# Patient Record
Sex: Male | Born: 1945 | Race: White | Hispanic: No | Smoking: Former smoker
Health system: Southern US, Community
[De-identification: ages and names within clinical notes are randomized; demographics above are authoritative.]

## PROBLEM LIST (undated history)

## (undated) DIAGNOSIS — R569 Unspecified convulsions: Secondary | ICD-10-CM

## (undated) DIAGNOSIS — K5900 Constipation, unspecified: Secondary | ICD-10-CM

## (undated) DIAGNOSIS — J449 Chronic obstructive pulmonary disease, unspecified: Secondary | ICD-10-CM

## (undated) DIAGNOSIS — F0150 Vascular dementia without behavioral disturbance: Secondary | ICD-10-CM

## (undated) DIAGNOSIS — H04129 Dry eye syndrome of unspecified lacrimal gland: Secondary | ICD-10-CM

## (undated) DIAGNOSIS — F0390 Unspecified dementia without behavioral disturbance: Secondary | ICD-10-CM

## (undated) DIAGNOSIS — I2510 Atherosclerotic heart disease of native coronary artery without angina pectoris: Secondary | ICD-10-CM

## (undated) DIAGNOSIS — D649 Anemia, unspecified: Secondary | ICD-10-CM

## (undated) DIAGNOSIS — D696 Thrombocytopenia, unspecified: Secondary | ICD-10-CM

## (undated) DIAGNOSIS — M1990 Unspecified osteoarthritis, unspecified site: Secondary | ICD-10-CM

## (undated) DIAGNOSIS — I679 Cerebrovascular disease, unspecified: Secondary | ICD-10-CM

## (undated) DIAGNOSIS — K219 Gastro-esophageal reflux disease without esophagitis: Secondary | ICD-10-CM

## (undated) DIAGNOSIS — I10 Essential (primary) hypertension: Secondary | ICD-10-CM

## (undated) DIAGNOSIS — E785 Hyperlipidemia, unspecified: Secondary | ICD-10-CM

## (undated) DIAGNOSIS — F329 Major depressive disorder, single episode, unspecified: Secondary | ICD-10-CM

## (undated) DIAGNOSIS — N289 Disorder of kidney and ureter, unspecified: Secondary | ICD-10-CM

---

## 1999-08-28 ENCOUNTER — Emergency Department (HOSPITAL_COMMUNITY): Admission: EM | Admit: 1999-08-28 | Discharge: 1999-08-28 | Payer: Self-pay | Admitting: Emergency Medicine

## 1999-11-28 ENCOUNTER — Emergency Department (HOSPITAL_COMMUNITY): Admission: EM | Admit: 1999-11-28 | Discharge: 1999-11-28 | Payer: Self-pay | Admitting: Emergency Medicine

## 1999-11-28 ENCOUNTER — Encounter: Payer: Self-pay | Admitting: Emergency Medicine

## 2003-05-22 ENCOUNTER — Other Ambulatory Visit: Payer: Self-pay

## 2007-11-14 ENCOUNTER — Ambulatory Visit: Payer: Self-pay | Admitting: Cardiology

## 2007-11-14 ENCOUNTER — Inpatient Hospital Stay (HOSPITAL_COMMUNITY): Admission: EM | Admit: 2007-11-14 | Discharge: 2007-11-15 | Payer: Self-pay | Admitting: Emergency Medicine

## 2007-11-15 ENCOUNTER — Encounter (INDEPENDENT_AMBULATORY_CARE_PROVIDER_SITE_OTHER): Payer: Self-pay | Admitting: *Deleted

## 2009-10-13 ENCOUNTER — Emergency Department: Payer: Self-pay | Admitting: Emergency Medicine

## 2009-10-30 ENCOUNTER — Emergency Department: Payer: Self-pay | Admitting: Emergency Medicine

## 2009-10-31 ENCOUNTER — Emergency Department: Payer: Self-pay | Admitting: Emergency Medicine

## 2009-11-01 ENCOUNTER — Inpatient Hospital Stay: Payer: Self-pay | Admitting: Unknown Physician Specialty

## 2009-11-09 ENCOUNTER — Emergency Department: Payer: Self-pay | Admitting: Emergency Medicine

## 2009-11-11 ENCOUNTER — Emergency Department: Payer: Self-pay | Admitting: Emergency Medicine

## 2009-11-13 ENCOUNTER — Emergency Department: Payer: Self-pay | Admitting: Emergency Medicine

## 2009-11-15 ENCOUNTER — Inpatient Hospital Stay: Payer: Self-pay | Admitting: Unknown Physician Specialty

## 2009-12-15 ENCOUNTER — Emergency Department: Payer: Self-pay | Admitting: Emergency Medicine

## 2009-12-16 ENCOUNTER — Emergency Department: Payer: Self-pay | Admitting: Internal Medicine

## 2009-12-17 ENCOUNTER — Emergency Department: Payer: Self-pay | Admitting: Emergency Medicine

## 2009-12-18 ENCOUNTER — Emergency Department: Payer: Self-pay | Admitting: Emergency Medicine

## 2010-01-23 ENCOUNTER — Ambulatory Visit: Payer: Self-pay | Admitting: Family Medicine

## 2010-01-27 ENCOUNTER — Ambulatory Visit: Payer: Self-pay | Admitting: Ophthalmology

## 2010-03-05 ENCOUNTER — Emergency Department: Payer: Self-pay | Admitting: Emergency Medicine

## 2010-03-07 ENCOUNTER — Inpatient Hospital Stay: Payer: Self-pay | Admitting: Psychiatry

## 2010-03-13 ENCOUNTER — Emergency Department: Payer: Self-pay | Admitting: Unknown Physician Specialty

## 2010-03-25 ENCOUNTER — Inpatient Hospital Stay: Payer: Self-pay | Admitting: Psychiatry

## 2010-04-01 ENCOUNTER — Inpatient Hospital Stay: Payer: Self-pay | Admitting: Internal Medicine

## 2010-04-08 LAB — PATHOLOGY REPORT

## 2010-04-16 ENCOUNTER — Ambulatory Visit: Payer: Self-pay | Admitting: Vascular Surgery

## 2010-04-28 ENCOUNTER — Emergency Department: Payer: Self-pay | Admitting: Emergency Medicine

## 2010-04-29 ENCOUNTER — Emergency Department: Payer: Self-pay | Admitting: Emergency Medicine

## 2010-05-29 ENCOUNTER — Ambulatory Visit: Payer: Self-pay | Admitting: Internal Medicine

## 2010-06-18 ENCOUNTER — Inpatient Hospital Stay: Payer: Self-pay | Admitting: Internal Medicine

## 2010-07-13 ENCOUNTER — Emergency Department: Payer: Self-pay | Admitting: Emergency Medicine

## 2010-07-19 ENCOUNTER — Inpatient Hospital Stay: Payer: Self-pay | Admitting: Psychiatry

## 2010-07-20 DIAGNOSIS — I499 Cardiac arrhythmia, unspecified: Secondary | ICD-10-CM

## 2010-08-25 NOTE — Consult Note (Signed)
NAMENATHIAN, Reginald Blair   MEDICAL RECORD NO.:  [id number]          PATIENT TYPE:  INP   LOCATION:  4739                         FACILITY:  [facility]   PHYSICIAN:  Antonietta Breach, M.D.  DATE OF BIRTH:  1945-07-14   DATE OF CONSULTATION:  11/15/2007  DATE OF DISCHARGE:                                 CONSULTATION   REQUESTING PHYSICIAN:  [facility].   REASON FOR CONSULTATION:  Alcohol dependence, suicidal ideation.   HISTORY OF PRESENT ILLNESS:  [name] is a 65 year old male  admitted to the [facility] on November 14, 2007 with chest pain and  suicidal ideation.   The patient does acknowledge alcohol relapse.  He also stated when he  first arrived to the hospital that he wanted to hurt himself.  He was  planning to cut his wrist.  He acknowledged a history of suicide attempt  in the past where he had to have surgery on his hand for cutting his  left wrist.   By the day of the consultation [name] is no longer having thoughts  of harming himself or others.  He is having intact hope and constructive  future goals.  He describes that he is going to get out and get back to  work.  He does not have hallucinations or delusions.  He does have  intact memory and orientation function.  He is socially appropriate.   PAST PSYCHIATRIC HISTORY:  [name] does acknowledge that he has a  history of bipolar disorder and has been stable when he is on his Prozac  combined with Depakote.  He also acknowledges that he has not been  compliant with those medications recently.   He also has a history of a seizure from alcohol withdrawal and does have  a history of delirium tremens in the past.   FAMILY PSYCHIATRIC HISTORY:  None known.  However, his father was killed  when he was coming out of a bar in his 22s.   SOCIAL HISTORY:  [name] has girlfriends the drink alcohol.  He has  a history of drinking 1/2 gallon of alcohol a day when he  binges.  He  does have a daughter.  He is divorced.   He works as a Education administrator   PAST MEDICAL HISTORY:  Seizure disorder.  He ran out of his Dilantin.   MENTAL STATUS EXAM:  [name] is alert.  He is oriented to all  spheres.  Attention span within normal limits.  Mood within normal  limits.  Affect mildly flat.  Memory function is intact.  Orientation  function intact.  Fund of knowledge, intelligence within normal limits.  Speech within normal limits.  Thought process logical, coherent, goal-  directed.  No looseness of associations.  Thought content:  No thoughts  of harming himself.  No thoughts of harming others.  No delusions or  hallucinations.  His insight regarding his alcohol dependence is intact,  judgment is intact.   ASSESSMENT:  Axis I:  Alcohol dependence.  Bipolar disorder, not  otherwise specified.  Axis II:  Deferred.  Axis III:  See past medical history.  Axis IV:  Primary support group.  Axis V:  55.   [name] is not at risk to harm himself or others.  He does agree to  call emergency services immediately for any thoughts of harming himself,  thoughts of harming others or other psychiatric emergency symptoms.   The undersigned reinforced education on the tools for preventing mood  episodes including medication and the tools of maintaining alcohol  dependence including 12-step groups.   The undersigned did recommend that the patient attend an inpatient  program, which would include chemical dependence rehabilitation,  prevention skills regarding relapse.  However, the patient declined.  He  will give it some thought and if he agrees to attend a program such as  [facility], he will call the [facility] number to receive  information.   He does agree to attend 12-step groups and follow up with the [facility] mental health center for his Depakote and Prozac supply   [name] declined residential treatment as above and he is no  longer  committable after recovering from his acute symptoms that were present  upon admission.   [name] is psychiatrically cleared for discharge.      Antonietta Breach, M.D.  Electronically Signed     JW/MEDQ  D:  11/15/2007  T:  11/15/2007  Job:  [id number]

## 2010-08-25 NOTE — H&P (Signed)
NAME:  Reginald Blair, Reginald Blair NO.:  [id number]   MEDICAL RECORD NO.:  [id number]          PATIENT TYPE:  INP   LOCATION:  1850                         FACILITY:  [facility]   PHYSICIAN:  Ladell Pier, M.D.   DATE OF BIRTH:  07/21/1945   DATE OF ADMISSION:  11/14/2007  DATE OF DISCHARGE:                              HISTORY & PHYSICAL   CHIEF COMPLAINT:  Chest pain and wanting to hurt himself.   HISTORY OF PRESENT ILLNESS:  The patient is a 65 year old white male,  past medical history significant for question of MI in the past, seizure  disorder secondary to alcohol withdrawal, hypertension.  The patient  stated that one day ago he had some substernal chest pain with some  diaphoresis but no shortness of breath.  No nausea or vomiting.  The  chest pain lasted about 20 minutes.  He had a similar episode in 1997  and he was told he had a heart attack then.  He said he did not have a  cardiac catheterization then.  He had a stress test.  He is not sure  what the result was.  He stated that he wanted to hurt himself.  He was  planning to cut his wrist.  He had multiple marks on his hand.  He said  he has tried it in the past and had to have surgery on his hand  secondary to almost cutting his left wrist off.  He said he has bipolar  and he is very depressed.   PAST MEDICAL HISTORY:  1. Significant for bipolar versus manic depression.  2. Hypertension.  3. Seizure from alcohol withdrawal.  4. Question of a history of MI in the past.  5. Alcohol and tobacco abuse.   FAMILY HISTORY:  Mother died of colon cancer at 16.  Father died in his  40s.  He was killed when he was coming out of a bar.   SOCIAL HISTORY:  Smokes about one pack of cigarettes per day.  Drinks  about half a gallon of alcohol a day when he goes on a binge.  He has  one daughter.  He is married.  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   MEDICATIONS:  He is supposed to be on Depakote 1000, but he  ran out,  Dilantin 100 twice a day, Prozac 40 mg daily and HCTZ 25 mg daily.   ALLERGIES:  No known drug allergies.   REVIEW OF SYSTEMS:  As per stated in the HPI.   PHYSICAL EXAMINATION:  VITAL SIGNS:  Temperature 98.4, blood pressure  168/91, pulse of 94, respirations 30, pulse ox 96% on room air.  HEENT:  Normocephalic, atraumatic.  Pupils reactive to light.  Throat  without erythema.  CARDIOVASCULAR:  Regular rate and rhythm.  LUNGS:  Clear bilaterally.  ABDOMEN:  Soft, nontender, nondistended.  Positive bowel sounds.  EXTREMITIES:  Without edema.  He does have healed scars on both arms and  forearms.   LABORATORIES:  WBC 3.7, hemoglobin 12.2, MCV of 72.8, platelet of 210.  UA negative.  UDS negative.  Sodium 135, potassium 3.5, chloride 100,  CO2 22, glucose 75, BUN 5, creatinine 0.67.  LFTs normal.  Alcohol level  142, lipase 22.  Valproic acid less than 10.  Dilantin level 3.6.  First  set of cardiac enzymes negative.  Chest x-ray:  No acute disease.  EKG:  No ST segment elevation or depression.   ASSESSMENT/PLAN:  1. Chest pain.  2. Hypertension.  3. Hypokalemia.  4. Alcohol and tobacco abuse.  5. History of alcohol withdrawal seizures.  6. Bipolar.  7. Suicidal ideation.  8. Microcytic anemia.   We will admit the patient to the hospital.  We will rule out for MI with  serial enzymes and repeat EKG in the morning and check 2-D echo.  Continue HCTZ for his hypertension, replete his potassium.  If it does  not correct in the morning, then we will check magnesium level.  We will  cover him with Librium for his alcohol use.  We will put him on a  nicotine patch for his tobacco use.  Will continue him on Dilantin and  Depakote.  We will continue him on his Prozac for his depression.  We  will do suicide precautions and get him a Comptroller.  We will check an  anemia panel for his microcytic anemia.      Ladell Pier, M.D.  Electronically Signed     NJ/MEDQ  D:   11/14/2007  T:  11/14/2007  Job:  [id number]

## 2010-08-25 NOTE — Discharge Summary (Signed)
NAMEGRAYLIN, SPERLING NO.:  [id number]   MEDICAL RECORD NO.:  [id number]          PATIENT TYPE:  INP   LOCATION:  4739                         FACILITY:  [facility]   PHYSICIAN:  Lonia Blood, M.D.       DATE OF BIRTH:  07/24/1945   DATE OF ADMISSION:  11/14/2007  DATE OF DISCHARGE:  11/15/2007                               DISCHARGE SUMMARY   PRIMARY CARE PHYSICIAN:  This patient does not have a primary care  physician.   DISCHARGE DIAGNOSES:  1. Chest pain, ruled out for myocardial infarction.  2. Alcoholism.  3. History of alcohol withdrawal seizures.  4. Bicytopenia, most likely due to medications and alcohol.  5. Suicidal ideations.   DISCHARGE MEDICATIONS:  1. Vitamin B1 100 mg daily.  2. Folic acid 1 mg daily.   CONDITION ON DISCHARGE:  Mr. Bunte has left the hospital, and he was  instructed to follow up with [facility].  He was also  provided with information about alcohol and drug services as well as  [facility] for psychiatric followup.  The patient was instructed to  report back to the hospital in case he feels suicidal or he wants to get  detoxed from alcohol.   CONSULTATION:  During this admission, this patient was seen in  consultation by Dr. Antonietta Breach from Psychiatry.   HOSPITAL COURSE:  1. Chest pain.  Mr. Almanza was admitted via the emergency room with      complaints of chest pain.  He indeed has some risk factors      including elevated LDL, his age, his family history, and being a      man.  The patient was ruled out for acute myocardial infarction,      and he was offered alcohol detoxification and to follow up with a      cardiac stress test.  Mr. Demetriou though decided to go against      these recommendations and not pursue his alcohol detoxification and      further cardiac workup for now.  He was instructed to follow up      with the [facility] if he decides to do it in the       future.  2. History of seizures, most likely alcohol withdrawal, as Mr.      Beer is not planning to stop drinking alcohol.  He is probably      not at high risk of having breakthrough seizures.  He was      noncompliant with his prior medications including Depakote and      phenytoin, but there is a good argument that he might actually not      need them.  If he decides to quit drinking alcohol, Depakote      probably would be the best one to use as it will act also      as a mood stabilizer.  3. Alcoholism.  Mr. Auker was offered alcohol detoxification in the      hospital and then outpatient, but he did refuse.  Lonia Blood, M.D.  Electronically Signed     SL/MEDQ  D:  11/15/2007  T:  11/16/2007  Job:  [id number]

## 2010-08-26 ENCOUNTER — Ambulatory Visit: Payer: Self-pay | Admitting: Ophthalmology

## 2010-09-16 ENCOUNTER — Emergency Department: Payer: Self-pay | Admitting: Emergency Medicine

## 2010-09-20 ENCOUNTER — Inpatient Hospital Stay: Payer: Self-pay | Admitting: Internal Medicine

## 2010-10-10 ENCOUNTER — Inpatient Hospital Stay: Payer: Self-pay | Admitting: Psychiatry

## 2010-10-28 ENCOUNTER — Emergency Department: Payer: Self-pay | Admitting: Emergency Medicine

## 2011-01-08 LAB — BASIC METABOLIC PANEL
BUN: 5 — ABNORMAL LOW
Calcium: 8.9
Creatinine, Ser: 0.77
GFR calc non Af Amer: >60
Glucose, Bld: 99
Potassium: 3.4 — ABNORMAL LOW

## 2011-01-08 LAB — RAPID URINE DRUG SCREEN, HOSP PERFORMED
Barbiturates: NOT DETECTED
Benzodiazepines: NOT DETECTED
Cocaine: NOT DETECTED
Opiates: NOT DETECTED

## 2011-01-08 LAB — DIFFERENTIAL
Basophils Absolute: 0
Basophils Absolute: 0
Eosinophils Absolute: 0.1
Eosinophils Relative: 1
Eosinophils Relative: 2
Lymphocytes Relative: 47 — ABNORMAL HIGH
Lymphs Abs: 1.2
Monocytes Absolute: 0.3
Monocytes Relative: 8
Neutro Abs: 2.4
Neutrophils Relative %: 39 — ABNORMAL LOW
Neutrophils Relative %: 64

## 2011-01-08 LAB — CBC
HCT: 36.5 — ABNORMAL LOW
Hemoglobin: 12.2 — ABNORMAL LOW
MCHC: 33.4
MCV: 72.8 — ABNORMAL LOW
Platelets: 186
Platelets: 210
RBC: 5.01
RDW: 19.4 — ABNORMAL HIGH
RDW: 19.9 — ABNORMAL HIGH
WBC: 2.6 — ABNORMAL LOW
WBC: 3.7 — ABNORMAL LOW

## 2011-01-08 LAB — LIPID PANEL
Triglycerides: 64
VLDL: 13

## 2011-01-08 LAB — COMPREHENSIVE METABOLIC PANEL
ALT: 20
AST: 24
Albumin: 3.9
Alkaline Phosphatase: 58
BUN: 5 — ABNORMAL LOW
CO2: 22
Calcium: 8.6
Chloride: 100
Creatinine, Ser: 0.67
GFR calc Af Amer: >60
GFR calc non Af Amer: >60
Glucose, Bld: 75
Potassium: 2.9 — ABNORMAL LOW
Sodium: 135
Total Bilirubin: 0.5
Total Protein: 7.4

## 2011-01-08 LAB — TSH
TSH: 2.478
TSH: 3.311

## 2011-01-08 LAB — IRON AND TIBC: Iron: 126

## 2011-01-08 LAB — CARDIAC PANEL(CRET KIN+CKTOT+MB+TROPI)
CK, MB: 1.5
Relative Index: INVALID
Total CK: 95
Total CK: 99
Troponin I: <0.01
Troponin I: <0.01

## 2011-01-08 LAB — D-DIMER, QUANTITATIVE: D-Dimer, Quant: 0.29

## 2011-01-08 LAB — RETICULOCYTES
RBC.: 4.94
Retic Count, Absolute: 29.6
Retic Ct Pct: 0.6

## 2011-01-08 LAB — VITAMIN B12: Vitamin B-12: 172 — ABNORMAL LOW (ref 211–911)

## 2011-01-08 LAB — CK TOTAL AND CKMB (NOT AT ARMC)
CK, MB: 2.2
Relative Index: 1.7
Total CK: 132

## 2011-01-08 LAB — URINALYSIS, ROUTINE W REFLEX MICROSCOPIC
Glucose, UA: NEGATIVE
Nitrite: NEGATIVE
Specific Gravity, Urine: 1.008
pH: 5.5

## 2011-01-08 LAB — ETHANOL: Alcohol, Ethyl (B): 142 — ABNORMAL HIGH

## 2011-05-05 ENCOUNTER — Ambulatory Visit: Payer: Medicare Other | Admitting: Family Medicine

## 2012-05-08 IMAGING — CT CT HEAD WITHOUT CONTRAST
3 of 4 series · 18 of 30 positions shown, 20 images · non-contrast
Comparison: none

REASON FOR EXAM: fall
COMMENTS:

PROCEDURE:     CT  - CT HEAD WITHOUT CONTRAST  - September 20, 2010  [DATE]
RESULT:
HISTORY: Fall.

[Series 2: without · axial · non-contrast · 0.43mm/px · z∈[-127,-12]mm · 8 of 31 slices shown, 10 images]
[im 4/31  brain]
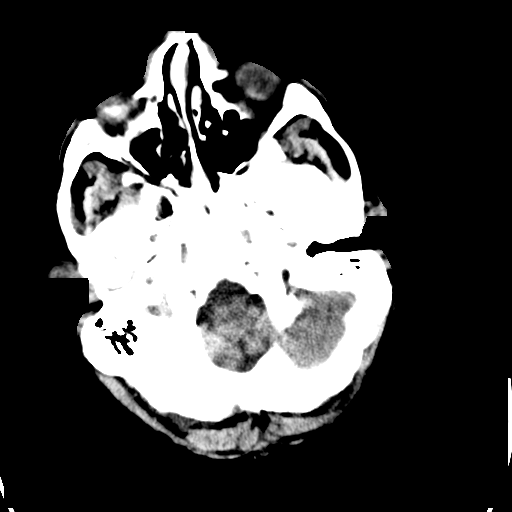
[im 4/31  bone]
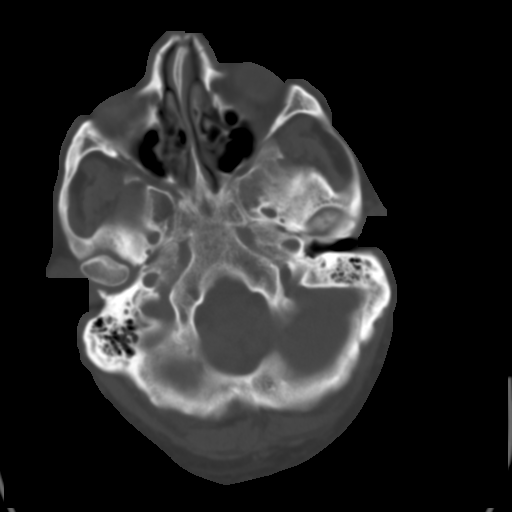
[im 7/31  brain]
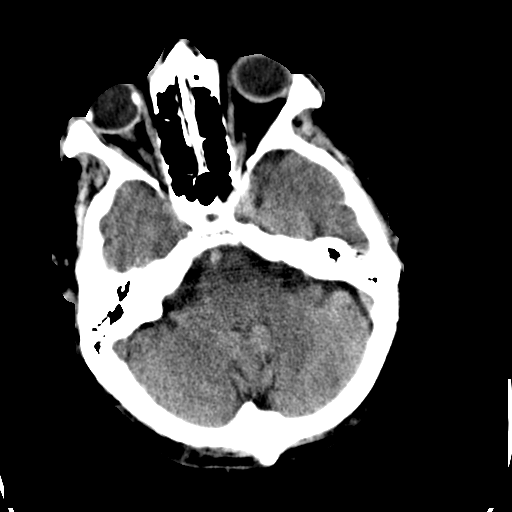
[im 11/31  brain]
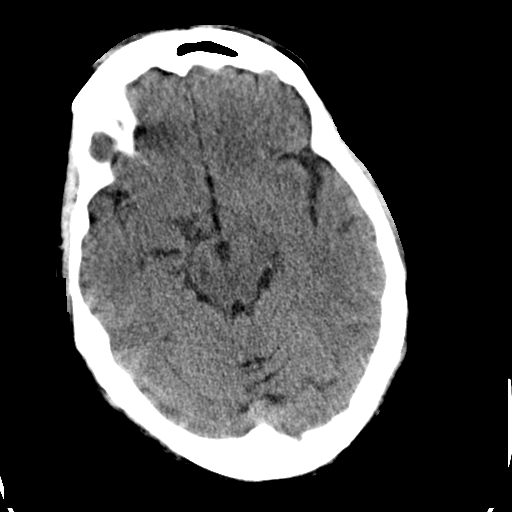
[im 14/31  brain]
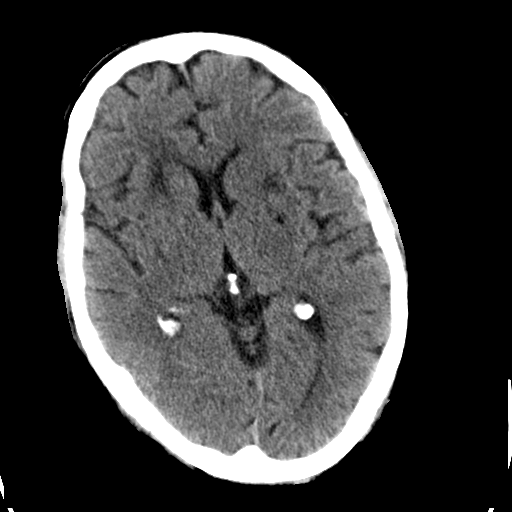
[im 17/31  brain]
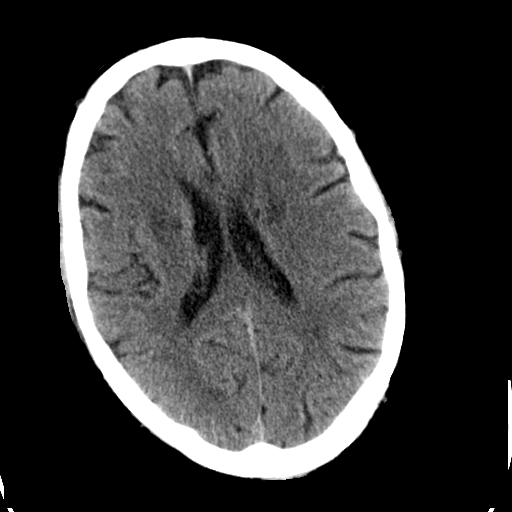
[im 17/31  bone]
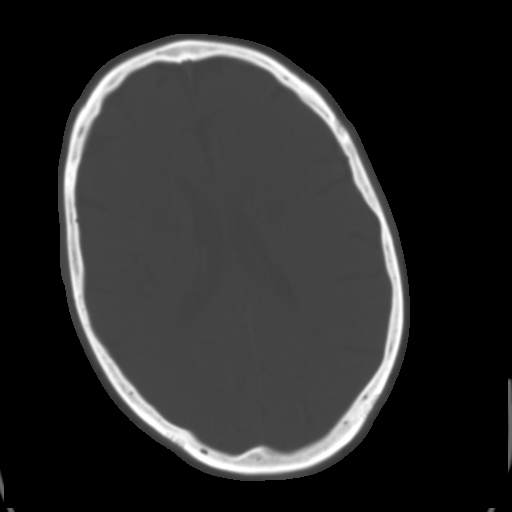
[im 21/31  brain]
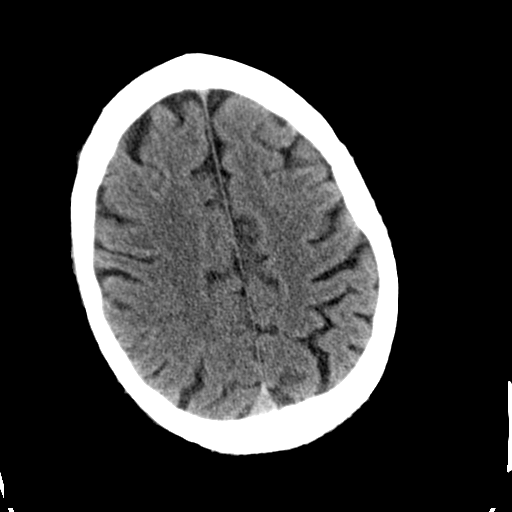
[im 24/31  brain]
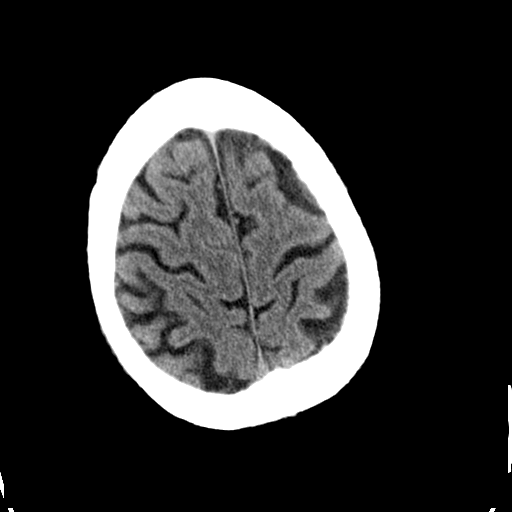
[im 27/31  brain]
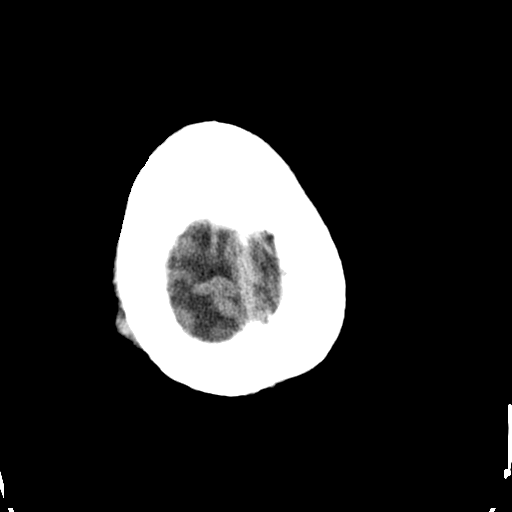

[Series 3: bone · axial · 0.43mm/px · z∈[-127,-12]mm · 7 of 31 slices shown (1 of 2)]
[im 4/31  bone]
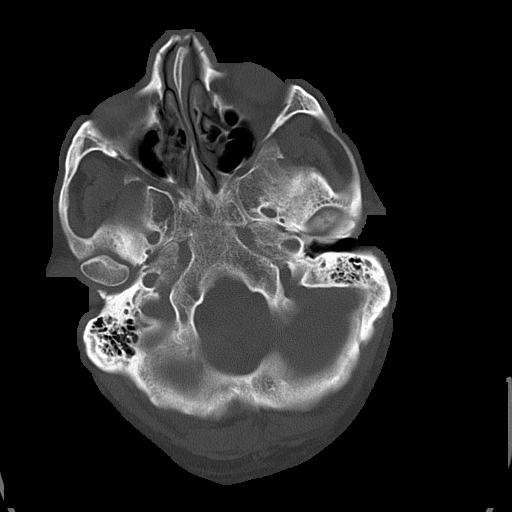
[im 8/31  bone]
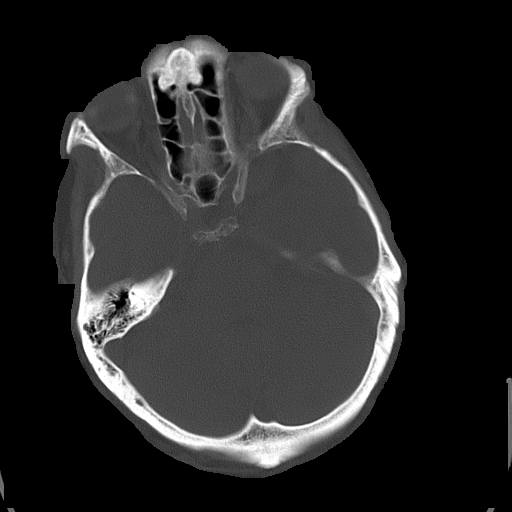
[im 12/31  bone]
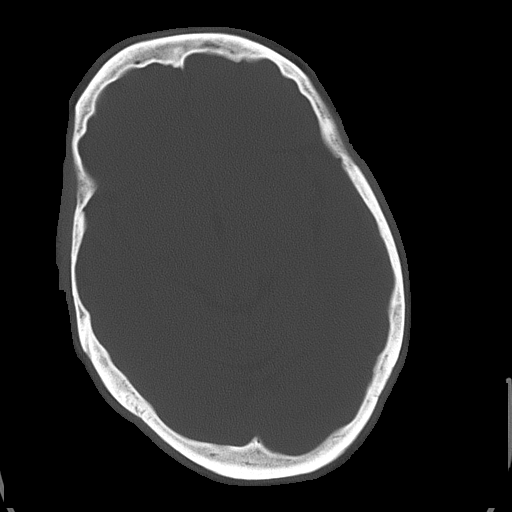
[im 16/31  bone]
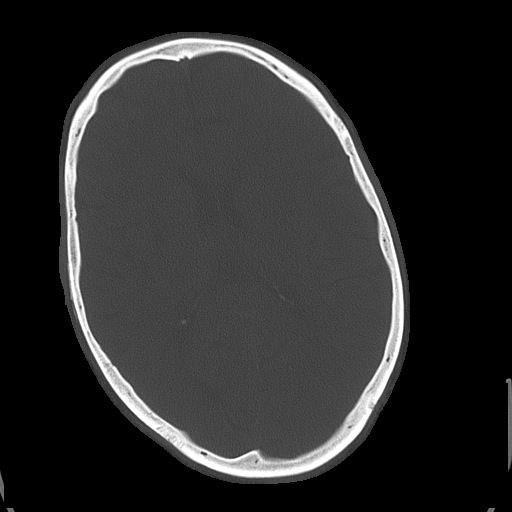
[im 19/31  bone]
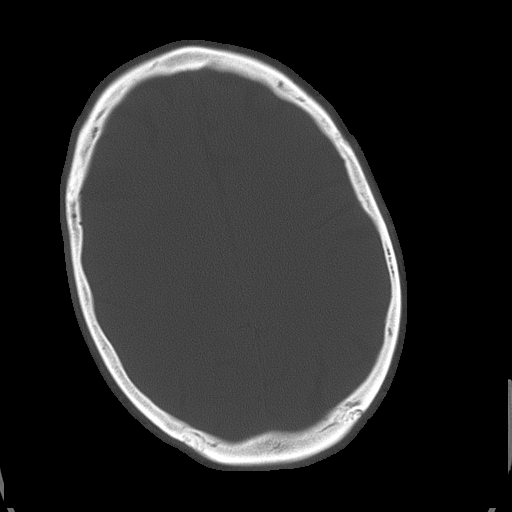
[im 23/31  bone]
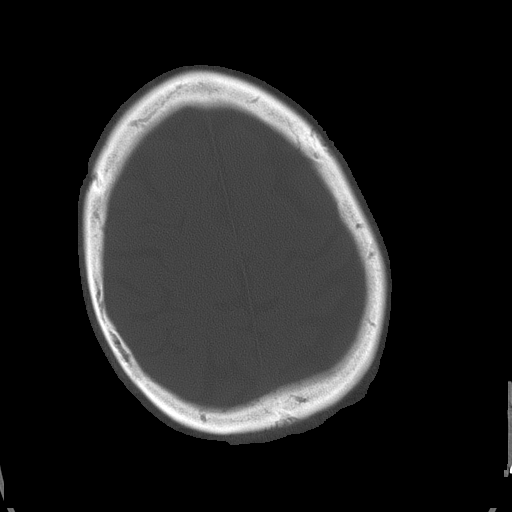
[im 27/31  bone]
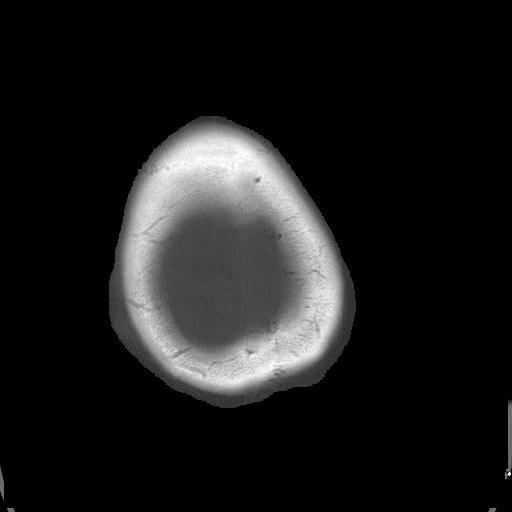

[Series 5: bone · axial · 0.43mm/px · z∈[-78,-38]mm · 3 of 18 slices shown (2 of 2)]
[im 5/18  bone]
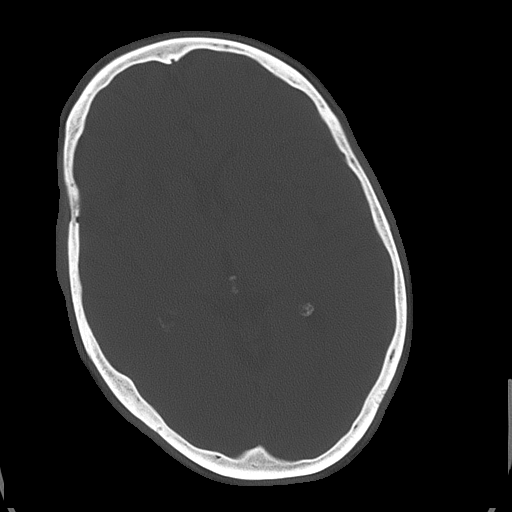
[im 9/18  bone]
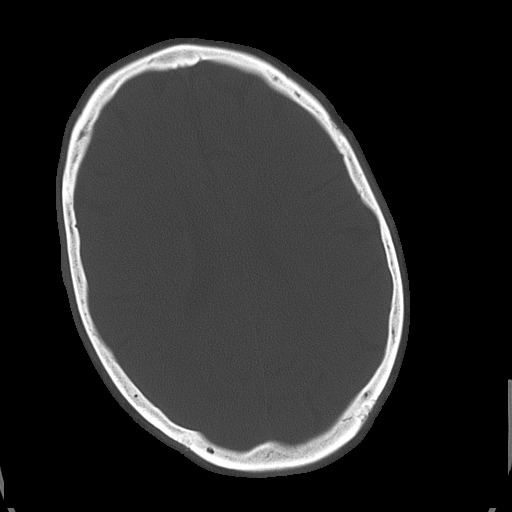
[im 13/18  bone]
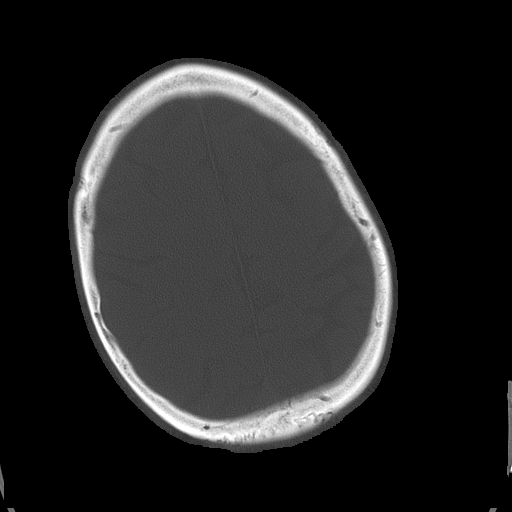

[18 of 30 positions shown; findings below may reference images not displayed]

FINDINGS: Standard nonenhanced CT was obtained. No mass lesion. No
hydrocephalus. No hemorrhage. White matter changes are noted consistent with
chronic ischemia. Lacunar infarctions are noted in the basal ganglia. No
acute bony abnormality.
IMPRESSION: 1.     No acute abnormality.
2.     Chronic ischemic change.

## 2012-05-09 IMAGING — US US RENAL KIDNEY
1 series · 17 of 25 positions shown · non-contrast
Comparison: none

REASON FOR EXAM: ARF
COMMENTS:

PROCEDURE:     US  - US KIDNEY  - September 21, 2010 [DATE]
RESULT:

[Series 1: us renal kidney · 17 of 59 slices shown]
[im 1/59]
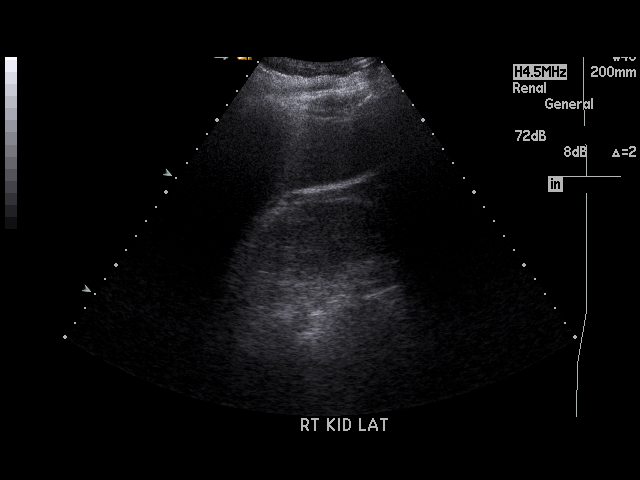
[im 5/59]
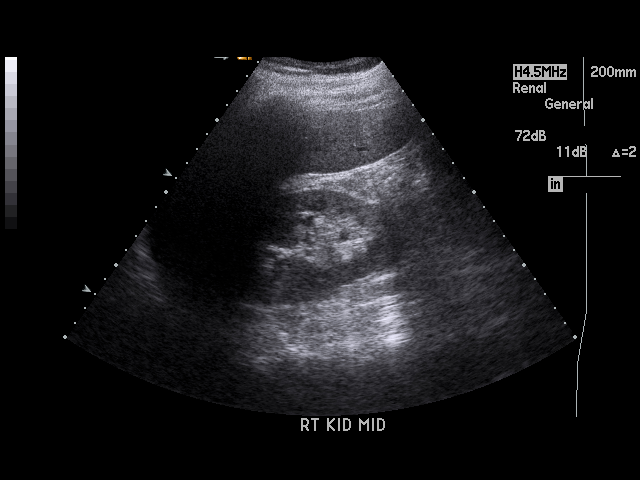
[im 8/59]
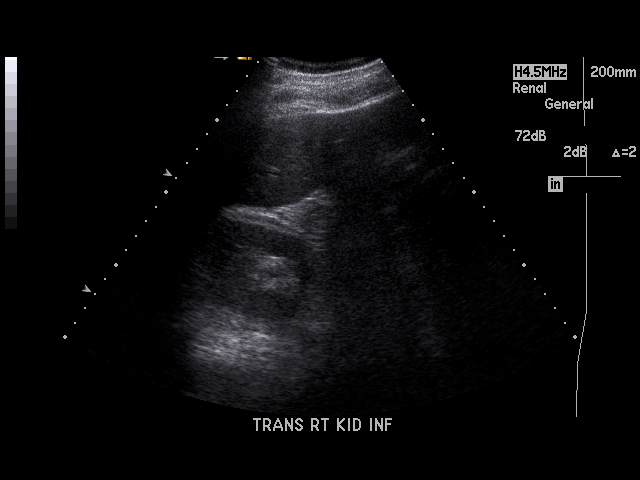
[im 13/59]
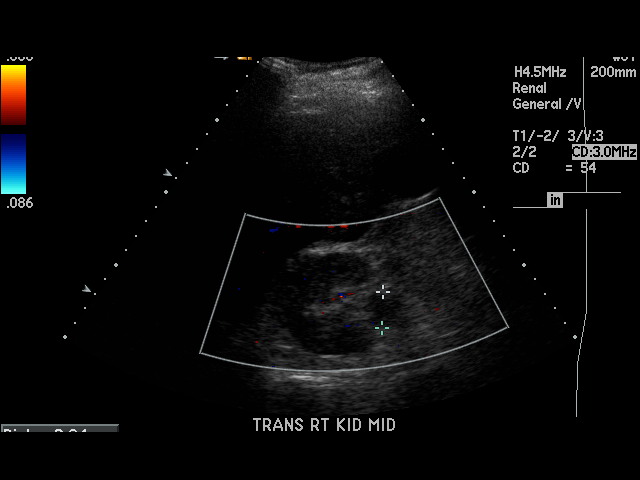
[im 15/59]
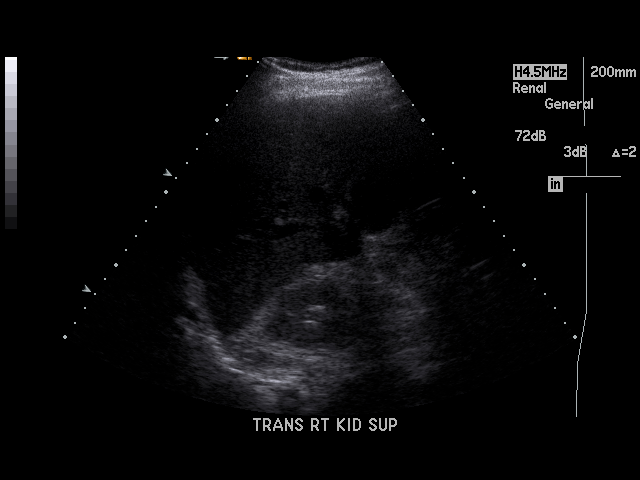
[im 20/59]
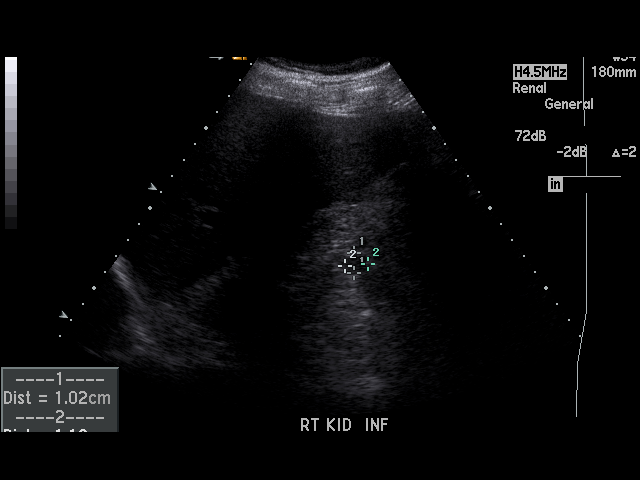
[im 22/59]
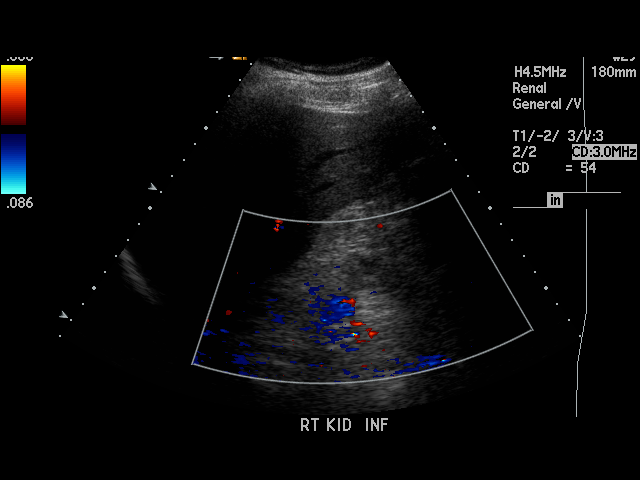
[im 27/59]
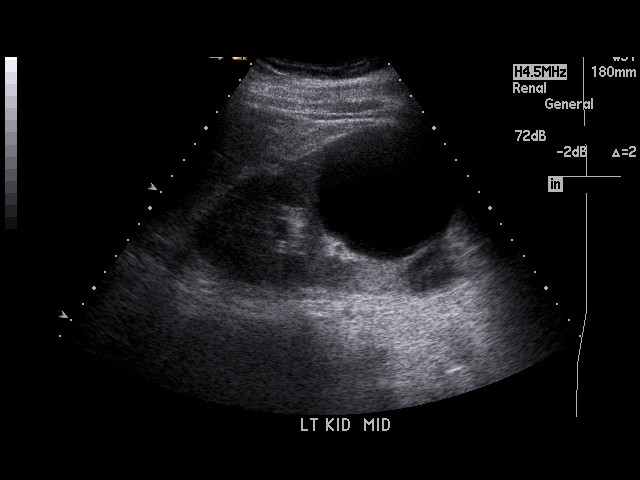
[im 30/59]
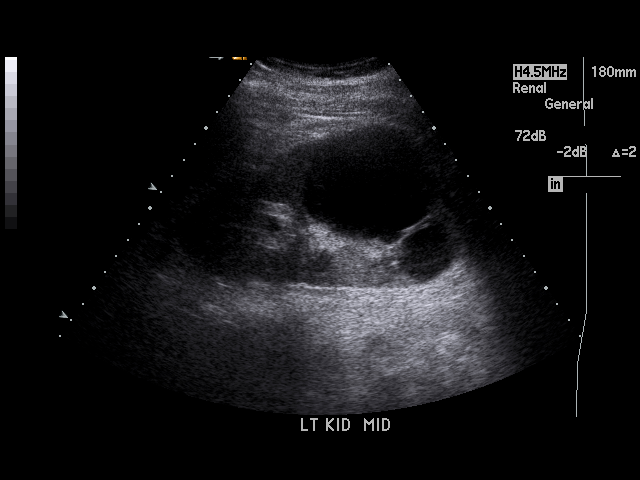
[im 32/59]
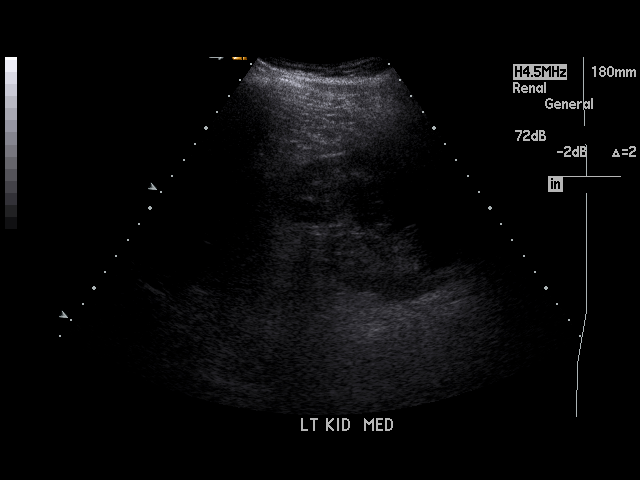
[im 37/59]
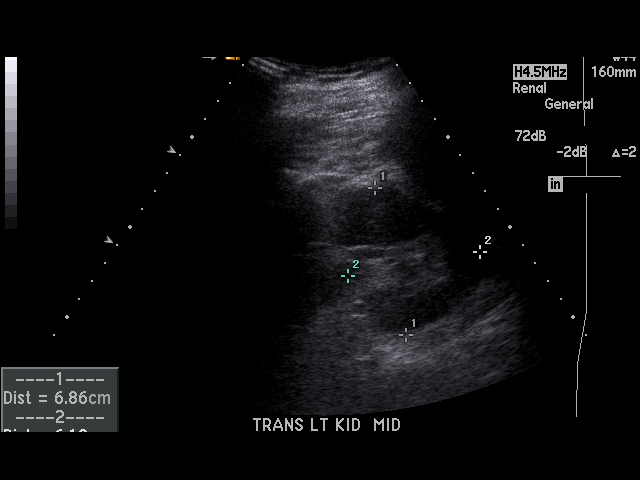
[im 39/59]
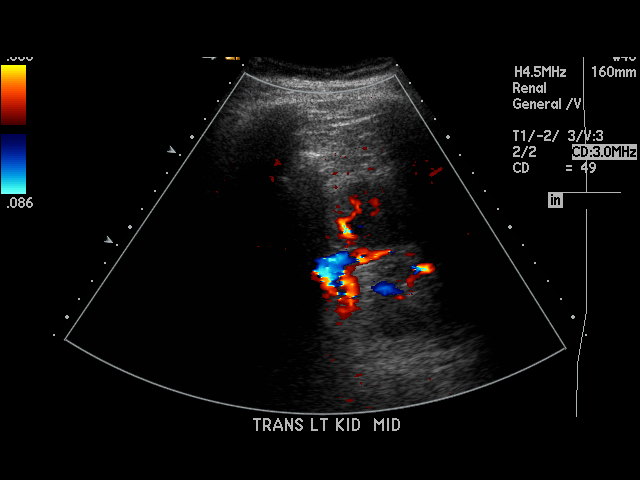
[im 44/59]
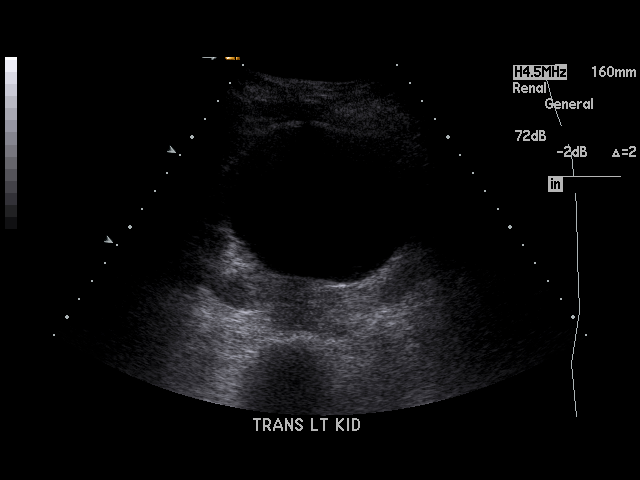
[im 46/59]
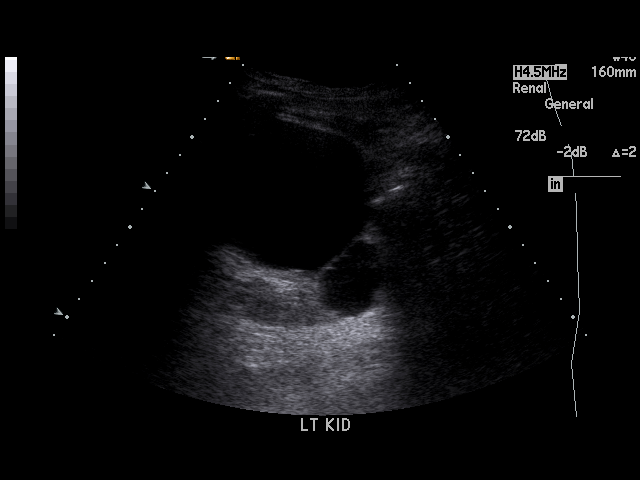
[im 51/59]
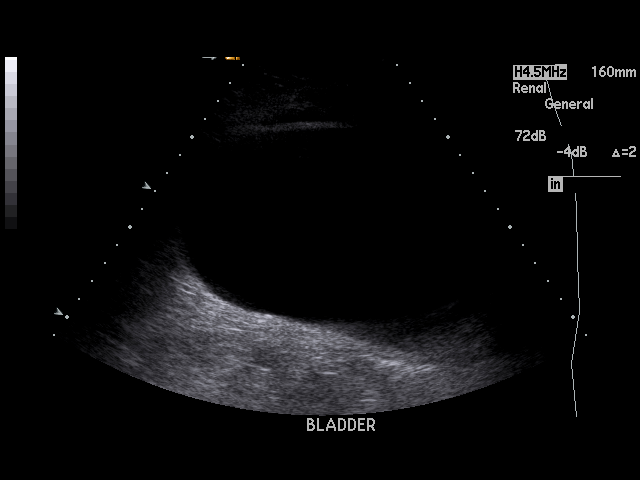
[im 54/59]
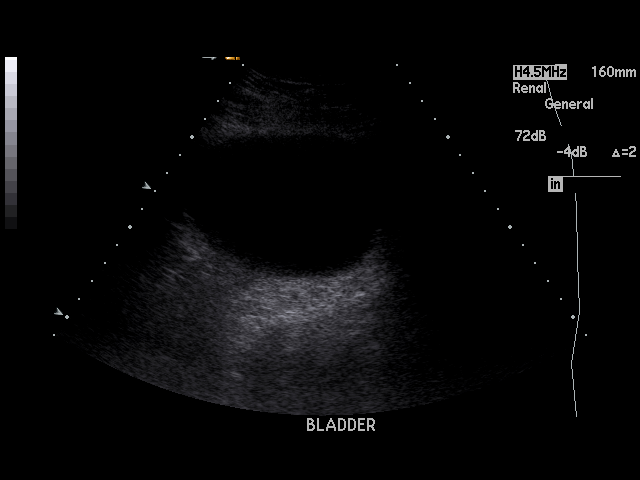
[im 59/59]
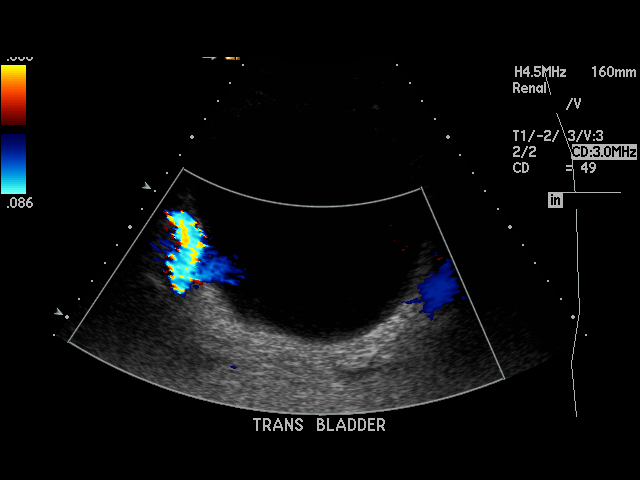

[17 of 25 positions shown; findings below may reference images not displayed]

FINDINGS: The right kidney measures 11.92 x 6 x 6 cm and the left 14.57 x
6.86 x 6.13 cm. Evaluation of the kidneys demonstrates no evidence of
hydronephrosis. A small 0.87 x 0.87 x 1.02 cm cyst is identified within the
right kidney. A larger lower pole cyst is identified within the left kidney
measuring 7.22 x 6.12 x 8.22 cm. A smaller cyst of the lower pole is also
identified within the left kidney measuring 3.66 x 2.56 x 3.62 cm. The
urinary bladder is distended and ureteral jets are not clearly identified.

The renal parenchyma otherwise demonstrates appropriate corticomedullary
differentiation without evidence of hydronephrosis or cortical thinning.
IMPRESSION: Bilateral renal cysts large on the left and small on the
right as described above.

## 2012-05-11 IMAGING — CR DG CHEST 1V PORT
1 series · 1 of 1 positions shown · non-contrast
Comparison: none

REASON FOR EXAM: wheezing, pneumonia, CHF
COMMENTS:

[view not recorded]
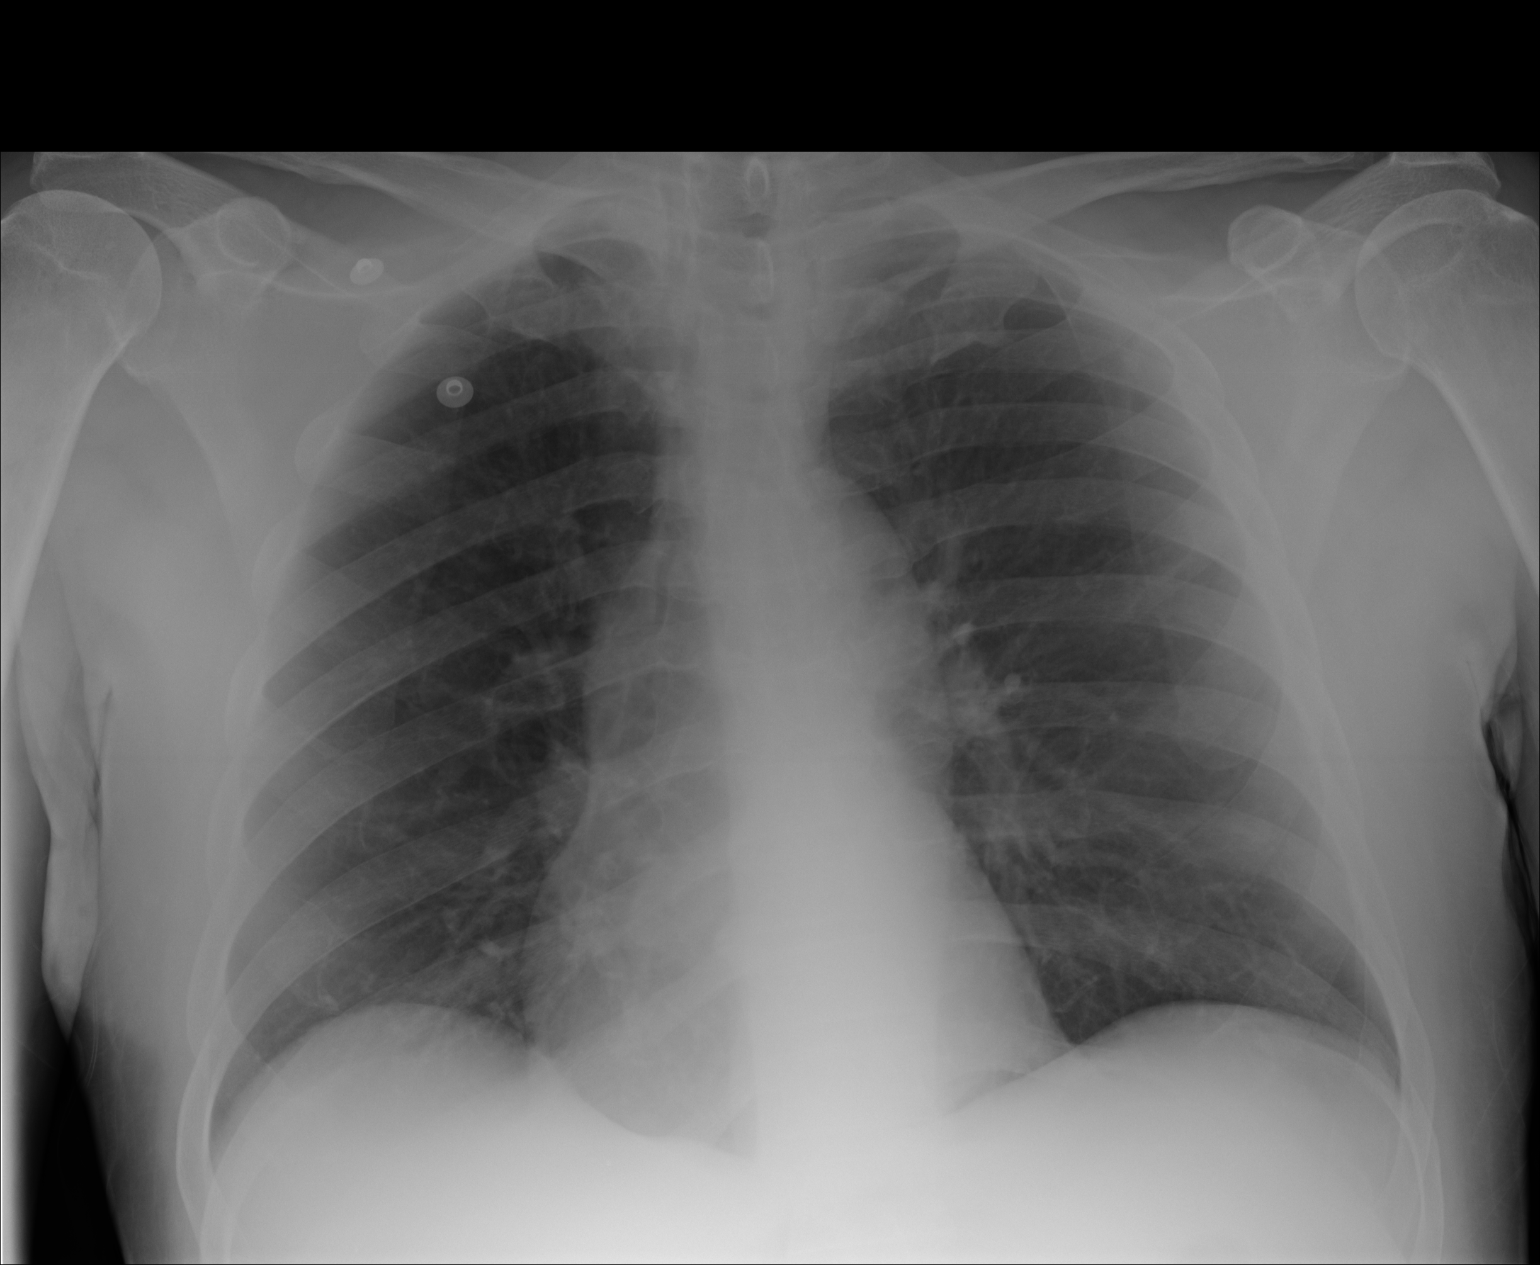

[1 of 1 positions shown; findings below may reference images not displayed]

PROCEDURE:     DXR - DXR PORTABLE CHEST SINGLE VIEW  - September 23, 2010 [DATE]

RESULT:     Comparison is made to study of 01/20/2011.

The lungs are adequately inflated and clear. The cardiac silhouette is
normal in size. The pulmonary vascularity is not engorged. I see no
pneumothorax or pleural effusion. There is mild tortuosity of the descending
thoracic aorta.
IMPRESSION: I do not see evidence of pneumonia nor other acute
cardiopulmonary abnormality.

## 2017-01-02 ENCOUNTER — Encounter: Payer: Self-pay | Admitting: Emergency Medicine

## 2017-01-02 ENCOUNTER — Emergency Department
Admission: EM | Admit: 2017-01-02 | Discharge: 2017-01-02 | Disposition: A | Discharge status: Home / Self Care | Payer: Medicare Other | Attending: Emergency Medicine | Admitting: Emergency Medicine

## 2017-01-02 DIAGNOSIS — Z87891 Personal history of nicotine dependence: Secondary | ICD-10-CM | POA: Diagnosis not present

## 2017-01-02 DIAGNOSIS — F32A Depression, unspecified: Secondary | ICD-10-CM

## 2017-01-02 DIAGNOSIS — R440 Auditory hallucinations: Secondary | ICD-10-CM | POA: Diagnosis present

## 2017-01-02 DIAGNOSIS — I10 Essential (primary) hypertension: Secondary | ICD-10-CM | POA: Diagnosis not present

## 2017-01-02 DIAGNOSIS — I2510 Atherosclerotic heart disease of native coronary artery without angina pectoris: Secondary | ICD-10-CM | POA: Diagnosis not present

## 2017-01-02 DIAGNOSIS — J449 Chronic obstructive pulmonary disease, unspecified: Secondary | ICD-10-CM | POA: Insufficient documentation

## 2017-01-02 DIAGNOSIS — F329 Major depressive disorder, single episode, unspecified: Secondary | ICD-10-CM | POA: Insufficient documentation

## 2017-01-02 HISTORY — DX: Unspecified dementia, unspecified severity, without behavioral disturbance, psychotic disturbance, mood disturbance, and anxiety: F03.90

## 2017-01-02 HISTORY — DX: Vascular dementia without behavioral disturbance: F01.50

## 2017-01-02 HISTORY — DX: Major depressive disorder, single episode, unspecified: F32.9

## 2017-01-02 HISTORY — DX: Thrombocytopenia, unspecified: D69.6

## 2017-01-02 HISTORY — DX: Gastro-esophageal reflux disease without esophagitis: K21.9

## 2017-01-02 HISTORY — DX: Dry eye syndrome of unspecified lacrimal gland: H04.129

## 2017-01-02 HISTORY — DX: Anemia, unspecified: D64.9

## 2017-01-02 HISTORY — DX: Disorder of kidney and ureter, unspecified: N28.9

## 2017-01-02 HISTORY — DX: Chronic obstructive pulmonary disease, unspecified: J44.9

## 2017-01-02 HISTORY — DX: Essential (primary) hypertension: I10

## 2017-01-02 HISTORY — DX: Atherosclerotic heart disease of native coronary artery without angina pectoris: I25.10

## 2017-01-02 HISTORY — DX: Unspecified convulsions: R56.9

## 2017-01-02 HISTORY — DX: Constipation, unspecified: K59.00

## 2017-01-02 HISTORY — DX: Hyperlipidemia, unspecified: E78.5

## 2017-01-02 HISTORY — DX: Unspecified osteoarthritis, unspecified site: M19.90

## 2017-01-02 HISTORY — DX: Cerebrovascular disease, unspecified: I67.9

## 2017-01-02 LAB — COMPREHENSIVE METABOLIC PANEL
ALBUMIN: 3.9 g/dL (ref 3.5–5.0)
ALT: 9 U/L — ABNORMAL LOW (ref 17–63)
AST: 27 U/L (ref 15–41)
Alkaline Phosphatase: 55 U/L (ref 38–126)
Anion gap: 11 (ref 5–15)
BUN: 14 mg/dL (ref 6–20)
CHLORIDE: 103 mmol/L (ref 101–111)
CO2: 25 mmol/L (ref 22–32)
Calcium: 8.8 mg/dL — ABNORMAL LOW (ref 8.9–10.3)
Creatinine, Ser: 1.15 mg/dL (ref 0.61–1.24)
GFR calc Af Amer: >60 mL/min (ref >60)
GLUCOSE: 119 mg/dL — AB (ref 65–99)
POTASSIUM: 3.7 mmol/L (ref 3.5–5.1)
Sodium: 139 mmol/L (ref 135–145)
Total Bilirubin: 0.6 mg/dL (ref 0.3–1.2)
Total Protein: 8.2 g/dL — ABNORMAL HIGH (ref 6.5–8.1)

## 2017-01-02 LAB — URINE DRUG SCREEN, QUALITATIVE (ARMC ONLY)
AMPHETAMINES, UR SCREEN: NOT DETECTED
Barbiturates, Ur Screen: NOT DETECTED
Benzodiazepine, Ur Scrn: NOT DETECTED
Cannabinoid 50 Ng, Ur ~~LOC~~: NOT DETECTED
Cocaine Metabolite,Ur ~~LOC~~: NOT DETECTED
MDMA (ECSTASY) UR SCREEN: NOT DETECTED
METHADONE SCREEN, URINE: NOT DETECTED
Opiate, Ur Screen: NOT DETECTED
PHENCYCLIDINE (PCP) UR S: NOT DETECTED
Tricyclic, Ur Screen: NOT DETECTED

## 2017-01-02 LAB — ACETAMINOPHEN LEVEL

## 2017-01-02 LAB — CBC
HCT: 40.9 % (ref 40.0–52.0)
Hemoglobin: 13.1 g/dL (ref 13.0–18.0)
MCH: 23.8 pg — ABNORMAL LOW (ref 26.0–34.0)
MCHC: 32.1 g/dL (ref 32.0–36.0)
MCV: 74.2 fL — AB (ref 80.0–100.0)
PLATELETS: 97 10*3/uL — AB (ref 150–440)
RBC: 5.51 MIL/uL (ref 4.40–5.90)
RDW: 17.3 % — ABNORMAL HIGH (ref 11.5–14.5)
WBC: 5 10*3/uL (ref 3.8–10.6)

## 2017-01-02 LAB — SALICYLATE LEVEL: Salicylate Lvl: <7 mg/dL (ref 2.8–30.0)

## 2017-01-02 LAB — ETHANOL

## 2017-01-02 MED ORDER — LORAZEPAM 0.5 MG PO TABS: 0.5000 mg | ORAL_TABLET | Freq: Three times a day (TID) | ORAL | 0 refills | Status: DC | PRN

## 2017-01-02 MED ORDER — OLANZAPINE 5 MG PO TABS
5.0000 mg | ORAL_TABLET | Freq: Once | ORAL | Status: AC
  Administered 2017-01-02: 5 mg via ORAL
  Filled 2017-01-02: qty 1

## 2017-01-02 NOTE — BH Assessment (Signed)
Assessment Note  Reginald Blair is an 71 y.o. male who arrived to [facility] today via staff personnel from [facility] where pt is currently living. Pt states the staff member brought him to the group home because he has been having suicidal thoughts about cutting himself, something he has done in the past and been hospitalized for 3-4 times (per his report). Pt admitted to having HI as well, though pt was unable to give specifics in regards to whom the victims would be. Pt showed clinician his wrists where he had cut himself in the past when asked if this was something he had done in the past.  Pt was quiet and mumbled throughout the assessment, though he was forthcoming with information. At times it appeared that pt was having difficulties understanding the questions posed. Pt provided information regarding prior substance use, which included past alcohol, marijuana, and cocaine use. Pt was did not understand questions in regards to how much of each substance he used, etc., so specifics regarding his substance use is unknown.  Pt was unable to answer whether he has a therapist or a psychiatrist, though he states he takes medication that helps him sleep 8-9 hours. He denies a history of verbal, physical or sexual abuse and states he had an uncle whom committed suicide. Pt denies any current charges, court dates, or being on probation. Pt expressed that he wants help because he felt good for several years when he was on medication 3x/day, which made it possible for him to not have hallucinations, and he would like to feel that way again.  Diagnosis: Major Depressive Disorder  Past Medical History:  Past Medical History:  Diagnosis Date  . Anemia   . Cerebrovascular disease   . Constipation   . COPD (chronic obstructive pulmonary disease) (HCC)   . Coronary artery disease   . Dry eye   . Dyslipidemia   . GERD (gastroesophageal reflux disease)   . Hypertension   . Major  depressive disorder   . Major neurocognitive disorder   . Osteoarthritis   . Renal disorder   . Seizure (HCC)   . Thrombocytopenia (HCC)     No past surgical history on file.  Family History: No family history on file.  Social History:  reports that he has quit smoking. He has never used smokeless tobacco. He reports that he does not drink alcohol or use drugs.  Additional Social History:  Alcohol / Drug Use Pain Medications: Pt denies Prescriptions: Pt denies Over the Counter: Pt denies History of alcohol / drug use?: Yes Substance #1 Name of Substance 1: Alcohol 1 - Age of First Use: Unknown 1 - Amount (size/oz): Unknown 1 - Frequency: Unknown 1 - Duration: Unknown 1 - Last Use / Amount: 2-3 years ago Substance #2 Name of Substance 2: Marijuana 2 - Age of First Use: Unknown 2 - Amount (size/oz): Unknown 2 - Frequency: Unknown 2 - Duration: Unknown 2 - Last Use / Amount: 2 years ago Substance #3 Name of Substance 3: Cocaine 3 - Age of First Use: Unknown 3 - Amount (size/oz): Unknown 3 - Frequency: Unknown 3 - Duration: Unknown 3 - Last Use / Amount: Years ago (pt was unable to provide more concrete information)  CIWA: CIWA-Ar BP: (!) 147/82 Pulse Rate: 80 COWS:    Allergies: No Known Allergies  Home Medications:  (Not in a hospital admission)  OB/GYN Status:  No LMP for male patient.  General Assessment Data Location of  Assessment: [facility] ED TTS Assessment: In system Is this a Tele or Face-to-Face Assessment?: Face-to-Face Is this an Initial Assessment or a Re-assessment for this encounter?: Initial Assessment Marital status: Divorced [place] name: N/A Is patient pregnant?: No Pregnancy Status: No Living Arrangements: Group Home ([facility]) Can pt return to current living arrangement?: Yes Admission Status: Voluntary Is patient capable of signing voluntary admission?: Yes Referral Source: Other (Group home staff member) Insurance type:  Medicare  Medical Screening Exam [facility] Walk-in ONLY) Medical Exam completed: Yes  Crisis Care Plan Living Arrangements: Group Home ([facility]) Legal Guardian: Other: ([facility]) Name of Psychiatrist: Unknown--pt unable to answer whether had psych or not Name of Therapist: Unknown--pt unable to answer whether had therapist or not  Education Status Is patient currently in school?: No Current Grade: N/A Highest grade of school patient has completed: 6th Name of school: N/A Contact person: N/A  Risk to self with the past 6 months Suicidal Ideation: Yes-Currently Present Has patient been a risk to self within the past 6 months prior to admission? : Yes Suicidal Intent: Yes-Currently Present Has patient had any suicidal intent within the past 6 months prior to admission? : Yes Is patient at risk for suicide?: Yes Suicidal Plan?: Yes-Currently Present (Pt plans to cut himself) Has patient had any suicidal plan within the past 6 months prior to admission? : Yes Specify Current Suicidal Plan: Pt plans to cut himself Access to Means: Yes Specify Access to Suicidal Means: Pt has access to knives if they are not kept locked away What has been your use of drugs/alcohol within the last 12 months?: Pt denies Previous Attempts/Gestures: Yes (Pt has previously attempted to kill himself by slitting wris) How many times?: 4 Triggers for Past Attempts: Hallucinations Intentional Self Injurious Behavior: Cutting Comment - Self Injurious Behavior: Pt has slit his wrists in the past Family Suicide History: Yes (Uncle) Recent stressful life event(s): Other (Comment) (Unknown) Persecutory voices/beliefs?: Yes Depression: No Depression Symptoms:  (None reported) Substance abuse history and/or treatment for substance abuse?: No Suicide prevention information given to non-admitted patients: Not applicable  Risk to Others within the past 6 months Homicidal  Ideation: Yes-Currently Present Thoughts of Harm to Others: Yes-Currently Present (Due to hallucinations) Comment - Thoughts of Harm to Others: Due to hallucinations Current Homicidal Intent: Yes-Currently Present (Due to hallucinations) Current Homicidal Plan: No Access to Homicidal Means: Yes (If pt has access to knives) Describe Access to Homicidal Means: If knives are kept unloced at group home Identified Victim: None noted History of harm to others?: No Assessment of Violence: On admission Violent Behavior Description: Pt has hallucinations of hurting himself and others Does patient have access to weapons?: No Criminal Charges Pending?: No Does patient have a court date: No Is patient on probation?: No  Psychosis Hallucinations: Auditory Delusions: None noted  Mental Status Report Appearance/Hygiene: Disheveled, In scrubs Eye Contact: Fair Motor Activity: Unable to assess Speech: Soft, Incoherent (Incoherent at times--required pt repeating self) Level of Consciousness: Alert Mood: Guilty, Sullen Affect: Sullen Anxiety Level: None Thought Processes: Flight of Ideas Judgement: Impaired Orientation: Place, Person, Time, Situation Obsessive Compulsive Thoughts/Behaviors: Minimal  Cognitive Functioning Concentration: Decreased Memory: Recent Intact, Remote Impaired IQ: Below Average Level of Function: Average Insight: Good Impulse Control: Fair Appetite: Good Sleep: No Change (Pt states he sleeps 8-9 hours if he takes his meds) Total Hours of Sleep: 9 Vegetative Symptoms: None  ADLScreening [facility] Assessment Services) Patient's cognitive ability adequate  to safely complete daily activities?: Yes Patient able to express need for assistance with ADLs?: Yes Independently performs ADLs?: Yes (appropriate for developmental age)  Prior Inpatient Therapy Prior Inpatient Therapy: Yes (Specifics were unknown by pt) Prior Therapy Dates: Unknown Prior Therapy  Facilty/Provider(s): Unknown Reason for Treatment: SI  Prior Outpatient Therapy Prior Outpatient Therapy:  (Unknown) Prior Therapy Dates: Unknown Prior Therapy Facilty/Provider(s): Unknown Reason for Treatment: SI Does patient have an ACCT team?: No Does patient have Intensive In-House Services?  : No Does patient have [facility] services? : No Does patient have P4CC services?: No  ADL Screening (condition at time of admission) Patient's cognitive ability adequate to safely complete daily activities?: Yes Is the patient deaf or have difficulty hearing?: No Does the patient have difficulty seeing, even when wearing glasses/contacts?: No Does the patient have difficulty concentrating, remembering, or making decisions?: Yes Patient able to express need for assistance with ADLs?: Yes Does the patient have difficulty dressing or bathing?: No Independently performs ADLs?: Yes (appropriate for developmental age) Does the patient have difficulty walking or climbing stairs?: No Weakness of Legs: None Weakness of Arms/Hands: None  Home Assistive Devices/Equipment Home Assistive Devices/Equipment: None  Therapy Consults (therapy consults require a physician order) PT Evaluation Needed: No OT Evalulation Needed: No SLP Evaluation Needed: No Abuse/Neglect Assessment (Assessment to be complete while patient is alone) Physical Abuse: Denies Verbal Abuse: Denies Sexual Abuse: Denies Exploitation of patient/patient's resources: Denies Self-Neglect: Denies Values / Beliefs Cultural Requests During Hospitalization: None Spiritual Requests During Hospitalization: None Consults Spiritual Care Consult Needed: No Social Work Consult Needed: No Merchant navy officer (For Healthcare) Does Patient Have a Medical Advance Directive?: No    Additional Information 1:1 In Past 12 Months?: No CIRT Risk: No Elopement Risk: No Does patient have medical clearance?: Yes     Disposition:     On Site  Evaluation by:   Reviewed with Physician:    Ralph Dowdy 01/02/2017 5:55 PM

## 2017-01-02 NOTE — ED Notes (Addendum)
Pt provided fluids on request, pt very anxious about returning to group home; pt spoken to about coming off ativan TID about the same time as transitioning to new group home, pt feels "I ain't been right since then", Dr Lamont Snowball discussed, PRN ativan prescript written, pt very thankful

## 2017-01-02 NOTE — ED Triage Notes (Signed)
Patient presents to ED via [facility] from [facility]. Patient is VOLUNTARY at this time. Patient reports seeing things and hearing things as well. Patient denies HI. Does express SI, states he would cut his throat. Patient reports command hallucinations, "theses voices are telling me they are going to get my drunk and cut me". Patient does have a legal guardian, [employer].

## 2017-01-02 NOTE — Discharge Instructions (Signed)
Today you were seen and evaluated by a psychiatrist who felt it was safe free to go home and to continue your current psychiatric regimen. It is critically important that you follow up with your psychiatrist in 2 days for reevaluation. Return to the emergency department for any concerns.  It was a pleasure to take care of you today, and thank you for coming to our emergency department.  If you have any questions or concerns before leaving please ask the nurse to grab me and I'm more than happy to go through your aftercare instructions again.  If you were prescribed any opioid pain medication today such as Norco, Vicodin, Percocet, morphine, hydrocodone, or oxycodone please make sure you do not drive when you are taking this medication as it can alter your ability to drive safely.  If you have any concerns once you are home that you are not improving or are in fact getting worse before you can make it to your follow-up appointment, please do not hesitate to call 911 and come back for further evaluation.  Merrily Brittle, MD  Results for orders placed or performed during the hospital encounter of 01/02/17  Comprehensive metabolic panel  Result Value Ref Range   Sodium 139 135 - 145 mmol/L   Potassium 3.7 3.5 - 5.1 mmol/L   Chloride 103 101 - 111 mmol/L   CO2 25 22 - 32 mmol/L   Glucose, Bld 119 (H) 65 - 99 mg/dL   BUN 14 6 - 20 mg/dL   Creatinine, Ser 1.61 0.61 - 1.24 mg/dL   Calcium 8.8 (L) 8.9 - 10.3 mg/dL   Total Protein 8.2 (H) 6.5 - 8.1 g/dL   Albumin 3.9 3.5 - 5.0 g/dL   AST 27 15 - 41 U/L   ALT 9 (L) 17 - 63 U/L   Alkaline Phosphatase 55 38 - 126 U/L   Total Bilirubin 0.6 0.3 - 1.2 mg/dL   GFR calc non Af Amer >60 >60 mL/min   GFR calc Af Amer >60 >60 mL/min   Anion gap 11 5 - 15  Ethanol  Result Value Ref Range   Alcohol, Ethyl (B) <5 <5 mg/dL  Salicylate level  Result Value Ref Range   Salicylate Lvl <7.0 2.8 - 30.0 mg/dL  Acetaminophen level  Result Value Ref Range   Acetaminophen (Tylenol), Serum <10 (L) 10 - 30 ug/mL  cbc  Result Value Ref Range   WBC 5.0 3.8 - 10.6 K/uL   RBC 5.51 4.40 - 5.90 MIL/uL   Hemoglobin 13.1 13.0 - 18.0 g/dL   HCT 09.6 04.5 - 40.9 %   MCV 74.2 (L) 80.0 - 100.0 fL   MCH 23.8 (L) 26.0 - 34.0 pg   MCHC 32.1 32.0 - 36.0 g/dL   RDW 81.1 (H) 91.4 - 78.2 %   Platelets 97 (L) 150 - 440 K/uL  Urine Drug Screen, Qualitative  Result Value Ref Range   Tricyclic, Ur Screen NONE DETECTED NONE DETECTED   Amphetamines, Ur Screen NONE DETECTED NONE DETECTED   MDMA (Ecstasy)Ur Screen NONE DETECTED NONE DETECTED   Cocaine Metabolite,Ur [place] NONE DETECTED NONE DETECTED   Opiate, Ur Screen NONE DETECTED NONE DETECTED   Phencyclidine (PCP) Ur S NONE DETECTED NONE DETECTED   Cannabinoid 50 Ng, Ur [place] NONE DETECTED NONE DETECTED   Barbiturates, Ur Screen NONE DETECTED NONE DETECTED   Benzodiazepine, Ur Scrn NONE DETECTED NONE DETECTED   Methadone Scn, Ur NONE DETECTED NONE DETECTED

## 2017-01-02 NOTE — ED Notes (Signed)
Contact with Rosine Abe, guardian, promises to expedite [employer] involvement tomorrow morning

## 2017-01-02 NOTE — ED Notes (Signed)
VOL/ PENDING SOC CONSULT  

## 2017-01-02 NOTE — ED Notes (Signed)
Report given to Margrett Rud [facility], [phone] (also [phone]), plan for pt to see primary and expedite appointment for pyschiatric follow-up, Erie Noe gave this RN phone number for guardian: Rosine Abe, [phone], [phone]  Erie Noe also reports unable to pick up pt and request transport home via EMS

## 2017-01-02 NOTE — ED Provider Notes (Signed)
[facility] Emergency Department Provider Note  ____________________________________________   First MD Initiated Contact with Patient 01/02/17 1604     (approximate)  I have reviewed the triage vital signs and the nursing notes.   HISTORY  Chief Complaint Suicidal   HPI Reginald Blair is a 71 y.o. male who self presents to the emergency department from his group home reporting command auditory hallucinations to harm himself. History is somewhat challenging to obtain as the patient is a poor historian however he says he was recently discharged from the psychiatric facility yesterday and feels he was let go to early. He went back to his group home and he began seeing things and hearing voices and he does not feel safe. He denies actually trying to hurt himself. He says that he wants to be compliant with his treatments. He says his symptoms have gradually progressive and haven't seemed to "creep up on me". Stress seems to make the symptoms worse and seeing a psychiatrist tends to make them better.   Past Medical History:  Diagnosis Date  . Anemia   . Cerebrovascular disease   . Constipation   . COPD (chronic obstructive pulmonary disease) (HCC)   . Coronary artery disease   . Dry eye   . Dyslipidemia   . GERD (gastroesophageal reflux disease)   . Hypertension   . Major depressive disorder   . Major neurocognitive disorder   . Osteoarthritis   . Renal disorder   . Seizure (HCC)   . Thrombocytopenia (HCC)     There are no active problems to display for this patient.   No past surgical history on file.  Prior to Admission medications   Medication Sig Start Date End Date Taking? Authorizing Provider  LORazepam (ATIVAN) 0.5 MG tablet Take 1 tablet (0.5 mg total) by mouth every 8 (eight) hours as needed for anxiety or sleep. 01/02/17 01/02/18  Merrily Brittle, MD    Allergies Patient has no known allergies.  No family history on file.  Social  History Social History  Substance Use Topics  . Smoking status: Former Games developer  . Smokeless tobacco: Never Used  . Alcohol use No    Review of Systems Constitutional: No fever/chills Eyes: No visual changes. ENT: No sore throat. Cardiovascular: Denies chest pain. Respiratory: Denies shortness of breath. Gastrointestinal: No abdominal pain.  No nausea, no vomiting.  No diarrhea.  No constipation. Genitourinary: Negative for dysuria. Musculoskeletal: Negative for back pain. Skin: Negative for rash. Neurological: Negative for headaches, focal weakness or numbness.   ____________________________________________   PHYSICAL EXAM:  VITAL SIGNS: ED Triage Vitals  Enc Vitals Group     BP 01/02/17 1508 117/74     Pulse Rate 01/02/17 1508 (!) 102     Resp 01/02/17 1508 17     Temp 01/02/17 1508 98.2 F (36.8 C)     Temp Source 01/02/17 1508 Oral     SpO2 01/02/17 1508 96 %     Weight 01/02/17 1509 160 lb (72.6 kg)     Height 01/02/17 1509  (1.753 m)     Head Circumference --      Peak Flow --      Pain Score --      Pain Loc --      Pain Edu? --      Excl. in GC? --     Constitutional: Alert and oriented 4 somewhat rambling speech no diaphoresis Eyes: PERRL EOMI. Head: Atraumatic. Nose: No congestion/rhinnorhea. Mouth/Throat:  No trismus Neck: No stridor.   Cardiovascular: Tachycardic rate, regular rhythm. Grossly normal heart sounds.  Good peripheral circulation. Respiratory: Normal respiratory effort.  No retractions. Lungs CTAB and moving good air Gastrointestinal: Soft nontender Musculoskeletal: No lower extremity edema   Neurologic:  Normal speech and language. No gross focal neurologic deficits are appreciated. Skin:  Skin is warm, dry and intact. No rash noted. Psychiatric: Strange affect    ____________________________________________   DIFFERENTIAL includes but not limited to  Suicidal ideation, suicide attempt, metabolic arrangement, schizophrenia,  malingering ____________________________________________   LABS (all labs ordered are listed, but only abnormal results are displayed)  Labs Reviewed  COMPREHENSIVE METABOLIC PANEL - Abnormal; Notable for the following:       Result Value   Glucose, Bld 119 (*)    Calcium 8.8 (*)    Total Protein 8.2 (*)    ALT 9 (*)    All other components within normal limits  ACETAMINOPHEN LEVEL - Abnormal; Notable for the following:    Acetaminophen (Tylenol), Serum <10 (*)    All other components within normal limits  CBC - Abnormal; Notable for the following:    MCV 74.2 (*)    MCH 23.8 (*)    RDW 17.3 (*)    Platelets 97 (*)    All other components within normal limits  ETHANOL  SALICYLATE LEVEL  URINE DRUG SCREEN, QUALITATIVE ([facility] ONLY)    Blood work reviewed by me shows no acute disease __________________________________________  EKG   ____________________________________________  RADIOLOGY   ____________________________________________   PROCEDURES  Procedure(s) performed: no  Procedures  Critical Care performed: no  Observation: no ____________________________________________   INITIAL IMPRESSION / ASSESSMENT AND PLAN / ED COURSE  Pertinent labs & imaging results that were available during my care of the patient were reviewed by me and considered in my medical decision making (see chart for details).  The patient arrives somewhat disheveled although hemodynamically stable with an unremarkable exam. He reports vague suicidality and auditory hallucinations. He consents to stay voluntarily so I will not place him under involuntary commitment. Psychiatry evaluation is pending     ----------------------------------------- 8:31 PM on 01/02/2017 -----------------------------------------  Specialist on-call Dr. Maricela Bo makes the current recommendations:  1.  Patient is psychiatrically cleared for return to group home. No current evidence of dangerousness to  self/others. Patient was just discharged from the hospital yesterday, evidently they felt he was at baseline. Regardless his medications are not known and the group phone cannot be reached to get information regarding his medication and so forth.   2. Medication recommendations:    1 time dose of Zyprexa 5 mg orally Continue current outpatient medications and outpatient doctor can be reached tomorrow morning. Any change to current regimen is deferred to outpatient provider.    3.  Patient is voluntary and does not meet criteria for involuntary commitment. ____________________________________________   FINAL CLINICAL IMPRESSION(S) / ED DIAGNOSES  Final diagnoses:  Depression, unspecified depression type      NEW MEDICATIONS STARTED DURING THIS VISIT:  There are no discharge medications for this patient.    Note:  This document was prepared using Dragon voice recognition software and may include unintentional dictation errors.     Merrily Brittle, MD 01/03/17 1413

## 2017-01-02 NOTE — ED Notes (Signed)
Offered Pt food and drink, refused at this time.AS

## 2017-01-02 NOTE — ED Notes (Signed)
VOL/PENDING PSYCH CONSULT °

## 2017-01-11 ENCOUNTER — Encounter: Payer: Self-pay | Admitting: Emergency Medicine

## 2017-01-11 ENCOUNTER — Emergency Department
Admission: EM | Admit: 2017-01-11 | Discharge: 2017-01-17 | Disposition: A | Discharge status: Home / Self Care | Payer: Medicare Other | Attending: Emergency Medicine | Admitting: Emergency Medicine

## 2017-01-11 DIAGNOSIS — F250 Schizoaffective disorder, bipolar type: Secondary | ICD-10-CM

## 2017-01-11 DIAGNOSIS — M7918 Myalgia, other site: Secondary | ICD-10-CM | POA: Insufficient documentation

## 2017-01-11 DIAGNOSIS — F201 Disorganized schizophrenia: Secondary | ICD-10-CM

## 2017-01-11 DIAGNOSIS — F209 Schizophrenia, unspecified: Secondary | ICD-10-CM

## 2017-01-11 DIAGNOSIS — I10 Essential (primary) hypertension: Secondary | ICD-10-CM | POA: Diagnosis not present

## 2017-01-11 DIAGNOSIS — M1990 Unspecified osteoarthritis, unspecified site: Secondary | ICD-10-CM

## 2017-01-11 DIAGNOSIS — R45851 Suicidal ideations: Secondary | ICD-10-CM | POA: Insufficient documentation

## 2017-01-11 DIAGNOSIS — Z87891 Personal history of nicotine dependence: Secondary | ICD-10-CM | POA: Insufficient documentation

## 2017-01-11 DIAGNOSIS — M25552 Pain in left hip: Secondary | ICD-10-CM | POA: Diagnosis present

## 2017-01-11 DIAGNOSIS — J449 Chronic obstructive pulmonary disease, unspecified: Secondary | ICD-10-CM | POA: Diagnosis not present

## 2017-01-11 LAB — URINE DRUG SCREEN, QUALITATIVE (ARMC ONLY)
AMPHETAMINES, UR SCREEN: NOT DETECTED
Barbiturates, Ur Screen: NOT DETECTED
Benzodiazepine, Ur Scrn: NOT DETECTED
COCAINE METABOLITE, UR ~~LOC~~: NOT DETECTED
Cannabinoid 50 Ng, Ur ~~LOC~~: NOT DETECTED
MDMA (Ecstasy)Ur Screen: NOT DETECTED
METHADONE SCREEN, URINE: NOT DETECTED
OPIATE, UR SCREEN: NOT DETECTED
Phencyclidine (PCP) Ur S: NOT DETECTED
Tricyclic, Ur Screen: NOT DETECTED

## 2017-01-11 LAB — CBC WITH DIFFERENTIAL/PLATELET
BASOS ABS: 0 10*3/uL (ref 0–0.1)
BASOS PCT: 0 %
Eosinophils Absolute: 0 10*3/uL (ref 0–0.7)
Eosinophils Relative: 0 %
HEMATOCRIT: 39.4 % — AB (ref 40.0–52.0)
HEMOGLOBIN: 12.8 g/dL — AB (ref 13.0–18.0)
LYMPHS PCT: 3 %
Lymphs Abs: 0.5 10*3/uL — ABNORMAL LOW (ref 1.0–3.6)
MCH: 24.1 pg — ABNORMAL LOW (ref 26.0–34.0)
MCHC: 32.4 g/dL (ref 32.0–36.0)
MCV: 74.5 fL — AB (ref 80.0–100.0)
MONO ABS: 1.1 10*3/uL — AB (ref 0.2–1.0)
Monocytes Relative: 6 %
NEUTROS ABS: 15.8 10*3/uL — AB (ref 1.4–6.5)
Neutrophils Relative %: 91 %
Platelets: 78 10*3/uL — ABNORMAL LOW (ref 150–440)
RBC: 5.29 MIL/uL (ref 4.40–5.90)
RDW: 17.2 % — AB (ref 11.5–14.5)
WBC: 17.4 10*3/uL — AB (ref 3.8–10.6)

## 2017-01-11 LAB — COMPREHENSIVE METABOLIC PANEL
ALBUMIN: 3.4 g/dL — AB (ref 3.5–5.0)
ALT: 8 U/L — AB (ref 17–63)
AST: 31 U/L (ref 15–41)
Alkaline Phosphatase: 50 U/L (ref 38–126)
Anion gap: 10 (ref 5–15)
BILIRUBIN TOTAL: 0.5 mg/dL (ref 0.3–1.2)
BUN: 25 mg/dL — AB (ref 6–20)
CO2: 26 mmol/L (ref 22–32)
CREATININE: 1.57 mg/dL — AB (ref 0.61–1.24)
Calcium: 8.4 mg/dL — ABNORMAL LOW (ref 8.9–10.3)
Chloride: 103 mmol/L (ref 101–111)
GFR calc Af Amer: 49 mL/min — ABNORMAL LOW (ref >60)
GFR, EST NON AFRICAN AMERICAN: 42 mL/min — AB (ref >60)
GLUCOSE: 143 mg/dL — AB (ref 65–99)
POTASSIUM: 4.6 mmol/L (ref 3.5–5.1)
Sodium: 139 mmol/L (ref 135–145)
TOTAL PROTEIN: 6.9 g/dL (ref 6.5–8.1)

## 2017-01-11 LAB — URINALYSIS, COMPLETE (UACMP) WITH MICROSCOPIC
Bilirubin Urine: NEGATIVE
GLUCOSE, UA: NEGATIVE mg/dL
Hgb urine dipstick: NEGATIVE
Ketones, ur: 5 mg/dL — AB
Leukocytes, UA: NEGATIVE
Nitrite: NEGATIVE
PH: 5 (ref 5.0–8.0)
Protein, ur: 100 mg/dL — AB
Specific Gravity, Urine: 1.024 (ref 1.005–1.030)

## 2017-01-11 LAB — TSH: TSH: 1.638 u[IU]/mL (ref 0.350–4.500)

## 2017-01-11 LAB — ETHANOL: Alcohol, Ethyl (B): <10 mg/dL (ref <10)

## 2017-01-11 MED ORDER — DIVALPROEX SODIUM 500 MG PO DR TAB
1000.0000 mg | DELAYED_RELEASE_TABLET | Freq: Every day | ORAL | Status: DC
  Administered 2017-01-11 – 2017-01-16 (×5): 1000 mg via ORAL
  Filled 2017-01-11 (×5): qty 2

## 2017-01-11 MED ORDER — POLYETHYLENE GLYCOL 3350 17 G PO PACK
17.0000 g | PACK | Freq: Every day | ORAL | Status: DC
  Administered 2017-01-12 – 2017-01-17 (×6): 17 g via ORAL
  Filled 2017-01-11 (×6): qty 1

## 2017-01-11 MED ORDER — DOCUSATE SODIUM 100 MG PO CAPS
ORAL_CAPSULE | ORAL | Status: AC
  Administered 2017-01-11: 100 mg via ORAL
  Filled 2017-01-11: qty 1

## 2017-01-11 MED ORDER — LEVETIRACETAM 500 MG PO TABS
ORAL_TABLET | ORAL | Status: AC
  Administered 2017-01-11: 750 mg via ORAL
  Filled 2017-01-11: qty 2

## 2017-01-11 MED ORDER — CLOZAPINE 100 MG PO TABS
200.0000 mg | ORAL_TABLET | Freq: Every day | ORAL | Status: DC
  Administered 2017-01-11 – 2017-01-16 (×6): 200 mg via ORAL
  Filled 2017-01-11: qty 2
  Filled 2017-01-11: qty 8
  Filled 2017-01-11: qty 2
  Filled 2017-01-11 (×2): qty 8
  Filled 2017-01-11 (×4): qty 2
  Filled 2017-01-11 (×2): qty 8

## 2017-01-11 MED ORDER — LEVETIRACETAM 750 MG PO TABS
750.0000 mg | ORAL_TABLET | Freq: Two times a day (BID) | ORAL | Status: DC
  Administered 2017-01-11 – 2017-01-17 (×11): 750 mg via ORAL
  Filled 2017-01-11 (×15): qty 1

## 2017-01-11 MED ORDER — CLOPIDOGREL BISULFATE 75 MG PO TABS
75.0000 mg | ORAL_TABLET | Freq: Every day | ORAL | Status: DC
  Administered 2017-01-12 – 2017-01-17 (×6): 75 mg via ORAL
  Filled 2017-01-11 (×6): qty 1

## 2017-01-11 MED ORDER — CLOZAPINE 25 MG PO TABS
ORAL_TABLET | ORAL | Status: AC
  Filled 2017-01-11: qty 4

## 2017-01-11 MED ORDER — DIVALPROEX SODIUM 500 MG PO DR TAB
DELAYED_RELEASE_TABLET | ORAL | Status: AC
  Administered 2017-01-11: 1000 mg via ORAL
  Filled 2017-01-11: qty 2

## 2017-01-11 MED ORDER — VENLAFAXINE HCL ER 75 MG PO CP24
150.0000 mg | ORAL_CAPSULE | Freq: Every day | ORAL | Status: DC
Start: 2017-01-12 — End: 2017-01-17
  Administered 2017-01-12 – 2017-01-17 (×6): 150 mg via ORAL
  Filled 2017-01-11: qty 2
  Filled 2017-01-11: qty 1
  Filled 2017-01-11 (×2): qty 2
  Filled 2017-01-11: qty 1
  Filled 2017-01-11 (×3): qty 2

## 2017-01-11 MED ORDER — ATORVASTATIN CALCIUM 20 MG PO TABS
10.0000 mg | ORAL_TABLET | Freq: Every day | ORAL | Status: DC
  Administered 2017-01-12 – 2017-01-16 (×3): 10 mg via ORAL
  Filled 2017-01-11 (×4): qty 1

## 2017-01-11 MED ORDER — CLOZAPINE 100 MG PO TABS
100.0000 mg | ORAL_TABLET | Freq: Every day | ORAL | Status: DC
  Administered 2017-01-12 – 2017-01-17 (×6): 100 mg via ORAL
  Filled 2017-01-11 (×2): qty 1
  Filled 2017-01-11: qty 4
  Filled 2017-01-11: qty 1
  Filled 2017-01-11 (×2): qty 4
  Filled 2017-01-11: qty 1
  Filled 2017-01-11: qty 4
  Filled 2017-01-11 (×3): qty 1

## 2017-01-11 MED ORDER — CLOZAPINE 25 MG PO TABS
ORAL_TABLET | ORAL | Status: AC
  Administered 2017-01-11: 200 mg via ORAL
  Filled 2017-01-11: qty 4

## 2017-01-11 MED ORDER — LEVETIRACETAM 500 MG PO TABS
ORAL_TABLET | ORAL | Status: AC
  Filled 2017-01-11: qty 2

## 2017-01-11 MED ORDER — PANTOPRAZOLE SODIUM 40 MG PO TBEC
40.0000 mg | DELAYED_RELEASE_TABLET | Freq: Every day | ORAL | Status: DC
Start: 2017-01-11 — End: 2017-01-17
  Administered 2017-01-12 – 2017-01-17 (×6): 40 mg via ORAL
  Filled 2017-01-11 (×6): qty 1

## 2017-01-11 MED ORDER — DOCUSATE SODIUM 100 MG PO CAPS
100.0000 mg | ORAL_CAPSULE | Freq: Two times a day (BID) | ORAL | Status: DC
  Administered 2017-01-11 – 2017-01-17 (×12): 100 mg via ORAL
  Filled 2017-01-11 (×11): qty 1

## 2017-01-11 NOTE — ED Notes (Signed)
Pt uprite on stretcher in hallway with no distress noted, no c/o voiced, pt calm & cooperative; resp even/unlab, lungs clear, apical audible & regular, +BS, abd soft/nondist/nontender, +periph pulses, -edema  ENVIRONMENTAL ASSESSMENT Potentially harmful objects out of patient reach: Yes.   Personal belongings secured: Yes.   Patient dressed in hospital provided attire only: Yes.   Plastic bags out of patient reach: Yes.   Patient care equipment (cords, cables, call bells, lines, and drains) shortened, removed, or accounted for: Yes.   Equipment and supplies removed from bottom of stretcher: Yes.   Potentially toxic materials out of patient reach: Yes.   Sharps container removed or out of patient reach: Yes.

## 2017-01-11 NOTE — Consult Note (Signed)
[facility] Consult   Reason for Consult:  Consult for 71 year old man with a history of chronic mental illness who is claiming suicidal ideation Referring Physician:  Jimmye Norman Patient Identification: Reginald Blair MRN:  [id number] Principal Diagnosis: Schizophrenia [facility]) Diagnosis:   Patient Active Problem List   Diagnosis Date Noted  . Schizophrenia ([place]) [F20.9] 01/11/2017  . Hypertension [I10] 01/11/2017  . Osteoarthritis [M19.90] 01/11/2017    Total Time spent with patient: 1 hour  Subjective:   Reginald Blair is a 70 y.o. male patient admitted with "don't send me back to that place".  HPI:  Patient seen chart reviewed. 71 year old man with a history of chronic mental illness came to the hospital apparently initially for complaints of falls or arthritis or joint pain. Started talking about how people at his group home were dangerous and he wanted to kill himself. Patient tells me a very incoherent story. He says that he had a something or other in the legs that he can't remember the name of and the people at the group home did something or other to him. Literally doesn't make any sense. Then he announces that he will kill himself if he has to go back there because the people are trying to hurt him. Looks very panicky and anxious. Denies any substance abuse. Says that he's been compliant with his medicine. Begs me repeatedly not to send him back to his group home.  Social history: Apparently lives at a local group home. Has a guardian. Rest of the details are unclear.  Medical history: Multiple medical problems including high blood pressure coronary artery disease COPD  Substance abuse history: Past history of alcohol abuse. Says that he does not drink anymore. Denies other drug abuse.  Past Psychiatric History: Patient has been diagnosed in the past with schizophrenia as well as cognitive disorder probably dementia. History of depression. He does have a history of  suicide attempts by cutting.  Risk to Self: Is patient at risk for suicide?: Yes Risk to Others:   Prior Inpatient Therapy:   Prior Outpatient Therapy:    Past Medical History:  Past Medical History:  Diagnosis Date  . Anemia   . Cerebrovascular disease   . Constipation   . COPD (chronic obstructive pulmonary disease) ([place])   . Coronary artery disease   . Dry eye   . Dyslipidemia   . GERD (gastroesophageal reflux disease)   . Hypertension   . Major depressive disorder   . Major neurocognitive disorder   . Osteoarthritis   . Renal disorder   . Seizure ([place])   . Thrombocytopenia ([place])    History reviewed. No pertinent surgical history. Family History: History reviewed. No pertinent family history. Family Psychiatric  History: He does not know of any family history Social History:  History  Alcohol Use No     History  Drug Use No    Social History   Social History  . Marital status: Divorced    Spouse name: N/A  . Number of children: N/A  . Years of education: N/A   Social History Main Topics  . Smoking status: Former Research scientist (life sciences)  . Smokeless tobacco: Never Used  . Alcohol use No  . Drug use: No  . Sexual activity: Not Asked   Other Topics Concern  . None   Social History Narrative  . None   Additional Social History:    Allergies:  No Known Allergies  Labs:  Results for orders placed or performed during the  hospital encounter of 01/11/17 (from the past 48 hour(s))  CBC with Differential     Status: Abnormal   Collection Time: 01/11/17  1:54 PM  Result Value Ref Range   WBC 17.4 (H) 3.8 - 10.6 K/uL   RBC 5.29 4.40 - 5.90 MIL/uL   Hemoglobin 12.8 (L) 13.0 - 18.0 g/dL   HCT 39.4 (L) 40.0 - 52.0 %   MCV 74.5 (L) 80.0 - 100.0 fL   MCH 24.1 (L) 26.0 - 34.0 pg   MCHC 32.4 32.0 - 36.0 g/dL   RDW 17.2 (H) 11.5 - 14.5 %   Platelets 78 (L) 150 - 440 K/uL   Neutrophils Relative % 91 %   Neutro Abs 15.8 (H) 1.4 - 6.5 K/uL   Lymphocytes Relative 3 %   Lymphs  Abs 0.5 (L) 1.0 - 3.6 K/uL   Monocytes Relative 6 %   Monocytes Absolute 1.1 (H) 0.2 - 1.0 K/uL   Eosinophils Relative 0 %   Eosinophils Absolute 0.0 0 - 0.7 K/uL   Basophils Relative 0 %   Basophils Absolute 0.0 0 - 0.1 K/uL  Comprehensive metabolic panel     Status: Abnormal   Collection Time: 01/11/17  1:54 PM  Result Value Ref Range   Sodium 139 135 - 145 mmol/L   Potassium 4.6 3.5 - 5.1 mmol/L    Comment: HEMOLYSIS AT THIS LEVEL MAY AFFECT RESULT   Chloride 103 101 - 111 mmol/L   CO2 26 22 - 32 mmol/L   Glucose, Bld 143 (H) 65 - 99 mg/dL   BUN 25 (H) 6 - 20 mg/dL   Creatinine, Ser 1.57 (H) 0.61 - 1.24 mg/dL   Calcium 8.4 (L) 8.9 - 10.3 mg/dL   Total Protein 6.9 6.5 - 8.1 g/dL   Albumin 3.4 (L) 3.5 - 5.0 g/dL   AST 31 15 - 41 U/L   ALT 8 (L) 17 - 63 U/L   Alkaline Phosphatase 50 38 - 126 U/L   Total Bilirubin 0.5 0.3 - 1.2 mg/dL   GFR calc non Af Amer 42 (L) >60 mL/min   GFR calc Af Amer 49 (L) >60 mL/min    Comment: (NOTE) The eGFR has been calculated using the CKD EPI equation. This calculation has not been validated in all clinical situations. eGFR's persistently <60 mL/min signify possible Chronic Kidney Disease.    Anion gap 10 5 - 15  TSH     Status: None   Collection Time: 01/11/17  1:54 PM  Result Value Ref Range   TSH 1.638 0.350 - 4.500 uIU/mL    Comment: Performed by a 3rd Generation assay with a functional sensitivity of <=0.01 uIU/mL.  Ethanol     Status: None   Collection Time: 01/11/17  1:54 PM  Result Value Ref Range   Alcohol, Ethyl (B) <10 <10 mg/dL    Comment:        LOWEST DETECTABLE LIMIT FOR SERUM ALCOHOL IS 10 mg/dL FOR MEDICAL PURPOSES ONLY Please note change in reference range.   Urinalysis, Complete w Microscopic     Status: Abnormal   Collection Time: 01/11/17  1:55 PM  Result Value Ref Range   Color, Urine AMBER (A) YELLOW    Comment: BIOCHEMICALS MAY BE AFFECTED BY COLOR   APPearance CLOUDY (A) CLEAR   Specific Gravity, Urine  1.024 1.005 - 1.030   pH 5.0 5.0 - 8.0   Glucose, UA NEGATIVE NEGATIVE mg/dL   Hgb urine dipstick NEGATIVE NEGATIVE   Bilirubin Urine  NEGATIVE NEGATIVE   Ketones, ur 5 (A) NEGATIVE mg/dL   Protein, ur 100 (A) NEGATIVE mg/dL   Nitrite NEGATIVE NEGATIVE   Leukocytes, UA NEGATIVE NEGATIVE   RBC / HPF 0-5 0 - 5 RBC/hpf   WBC, UA 6-30 0 - 5 WBC/hpf   Bacteria, UA RARE (A) NONE SEEN   Squamous Epithelial / LPF 0-5 (A) NONE SEEN   Mucus PRESENT    Hyaline Casts, UA PRESENT    Granular Casts, UA PRESENT     Current Facility-Administered Medications  Medication Dose Route Frequency Provider Last Rate Last Dose  . [START ON 01/12/2017] atorvastatin (LIPITOR) tablet 10 mg  10 mg Oral q1800 Jamyron Redd T, MD      . clopidogrel (PLAVIX) tablet 75 mg  75 mg Oral Daily Jarvin Ogren T, MD      . cloZAPine (CLOZARIL) tablet 100 mg  100 mg Oral Daily Tamu Golz T, MD      . cloZAPine (CLOZARIL) tablet 200 mg  200 mg Oral QHS Kydan Shanholtzer T, MD      . divalproex (DEPAKOTE) DR tablet 1,000 mg  1,000 mg Oral QHS Leilanee Righetti T, MD      . docusate sodium (COLACE) capsule 100 mg  100 mg Oral BID Terrisha Lopata T, MD      . levETIRAcetam (KEPPRA) tablet 750 mg  750 mg Oral BID Rashanda Magloire T, MD      . pantoprazole (PROTONIX) EC tablet 40 mg  40 mg Oral Daily Zeidy Tayag T, MD      . polyethylene glycol (MIRALAX / GLYCOLAX) packet 17 g  17 g Oral Daily Adil Tugwell T, MD      . Derrill Memo ON 01/12/2017] venlafaxine XR (EFFEXOR-XR) 24 hr capsule 150 mg  150 mg Oral Q breakfast Morissa Obeirne, Madie Reno, MD       Current Outpatient Prescriptions  Medication Sig Dispense Refill  . LORazepam (ATIVAN) 0.5 MG tablet Take 1 tablet (0.5 mg total) by mouth every 8 (eight) hours as needed for anxiety or sleep. 5 tablet 0    Musculoskeletal: Strength & Muscle Tone: within normal limits Gait & Station: normal Patient leans: N/A  Psychiatric Specialty Exam: Physical Exam  Nursing note and vitals  reviewed. Constitutional: He appears well-developed and well-nourished.  HENT:  Head: Normocephalic and atraumatic.  Eyes: Pupils are equal, round, and reactive to light. Conjunctivae are normal.  Neck: Normal range of motion.  Cardiovascular: Regular rhythm and normal heart sounds.   Respiratory: Effort normal. No respiratory distress.  GI: Soft.  Musculoskeletal: Normal range of motion.  Neurological: He is alert.  Skin: Skin is warm and dry.  Psychiatric: His mood appears anxious. His speech is tangential. He is agitated. He is not aggressive. Thought content is paranoid. He expresses impulsivity. He exhibits a depressed mood. He expresses suicidal ideation. He exhibits abnormal recent memory and abnormal remote memory.    Review of Systems  Constitutional: Negative.   HENT: Negative.   Eyes: Negative.   Respiratory: Negative.   Cardiovascular: Negative.   Gastrointestinal: Negative.   Musculoskeletal: Positive for joint pain.  Skin: Negative.   Neurological: Negative.   Psychiatric/Behavioral: Positive for depression, memory loss and suicidal ideas. Negative for hallucinations and substance abuse. The patient is nervous/anxious and has insomnia.     Blood pressure 110/69, pulse 90, temperature 98.3 F (36.8 C), temperature source Oral, resp. rate 18, height 5' 9"  (1.753 m), weight 72.6 kg (160 lb), SpO2 94 %.Body mass index is  23.63 kg/m.  General Appearance: Disheveled  Eye Contact:  Fair  Speech:  Garbled  Volume:  Increased  Mood:  Anxious, Depressed and Dysphoric  Affect:  Congruent and Inappropriate  Thought Process:  Disorganized  Orientation:  Negative  Thought Content:  Illogical, Paranoid Ideation, Rumination and Tangential  Suicidal Thoughts:  Yes.  with intent/plan  Homicidal Thoughts:  Yes.  without intent/plan  Memory:  Immediate;   Fair Recent;   Poor Remote;   Poor  Judgement:  Poor  Insight:  Lacking  Psychomotor Activity:  Restlessness   Concentration:  Concentration: Poor  Recall:  Poor  Fund of Knowledge:  Poor  Language:  Poor  Akathisia:  No  Handed:  Right  AIMS (if indicated):     Assets:  Desire for Improvement Housing Social Support  ADL's:  Impaired  Cognition:  Impaired,  Moderate  Sleep:        Treatment Plan Summary: Daily contact with patient to assess and evaluate symptoms and progress in treatment, Medication management and Plan 71 year old man with a history of chronic mental health problems. He comes to the emergency room panicky begging not to go back to his group home saying he is going to kill himself. Patient does not appear to be intoxicated. He is uncooperative with any idea of going back to his group home. I will continue his medicine for now. I have requested that we refer him to geriatric psychiatry units. We'll continually reassess in case he calms down and is willing to go back home.  Disposition: Recommend psychiatric Inpatient admission when medically cleared. Supportive therapy provided about ongoing stressors.  Alethia Berthold, MD 01/11/2017 6:24 PM

## 2017-01-11 NOTE — ED Notes (Signed)
BEHAVIORAL HEALTH ROUNDING Patient sleeping: No. Patient alert and oriented: yes Behavior appropriate: Yes.  ; If no, describe:  Nutrition and fluids offered: yes Toileting and hygiene offered: Yes  Sitter present: q15 minute observations and security camera monitoring Law enforcement present: Yes  ODS  

## 2017-01-11 NOTE — BH Assessment (Signed)
Assessment Note  Reginald Blair is an 71 y.o. male who presents to the ER due to medical concerns. Upon time for discharge patient reported he was having SI. He states he did not want to return to his current Group Home. Patient further shared the staff was trying to harm him. Patient states the medications they are giving him is causing him to become paralyze and he can't move at night.  Patient have a history of psychosis and delusional thinking. When shared he would be going back to the Group Home, became fearful and irrational. Patient repeatedly shared he was fearful for his life.  During the interview, the patient was calm, cooperative and pleasant until he was told he was going to be discharged back home. At times he became tearful due to the fear of being murdered.   Diagnosis: Schizophrenia  Past Medical History:  Past Medical History:  Diagnosis Date  . Anemia   . Cerebrovascular disease   . Constipation   . COPD (chronic obstructive pulmonary disease) (HCC)   . Coronary artery disease   . Dry eye   . Dyslipidemia   . GERD (gastroesophageal reflux disease)   . Hypertension   . Major depressive disorder   . Major neurocognitive disorder   . Osteoarthritis   . Renal disorder   . Seizure (HCC)   . Thrombocytopenia (HCC)     History reviewed. No pertinent surgical history.  Family History: History reviewed. No pertinent family history.  Social History:  reports that he has quit smoking. He has never used smokeless tobacco. He reports that he does not drink alcohol or use drugs.  Additional Social History:  Alcohol / Drug Use Pain Medications: See PTA Prescriptions: See PTA Over the Counter: See PTA History of alcohol / drug use?: No history of alcohol / drug abuse Longest period of sobriety (when/how long): n/a Negative Consequences of Use:  (n/a)  CIWA: CIWA-Ar BP: 110/69 Pulse Rate: 90 COWS:    Allergies: No Known Allergies  Home Medications:  (Not in a  hospital admission)  OB/GYN Status:  No LMP for male patient.  General Assessment Data Assessment unable to be completed: Yes Location of Assessment: [facility] ED TTS Assessment: In system Is this a Tele or Face-to-Face Assessment?: Face-to-Face Is this an Initial Assessment or a Re-assessment for this encounter?: Initial Assessment Marital status: Divorced [place] name: n/a Is patient pregnant?: No Pregnancy Status: No Living Arrangements: Group Home ([facility])) Can pt return to current living arrangement?: Yes Admission Status: Voluntary Is patient capable of signing voluntary admission?: Yes Referral Source: Self/Family/Friend Insurance type: MCR  Medical Screening Exam [facility] Walk-in ONLY) Medical Exam completed: Yes  Crisis Care Plan Living Arrangements: Group Home ([facility])) Legal Guardian: Other: ([facility] ) Name of Psychiatrist: Reports of none Name of Therapist: Reports of none  Education Status Is patient currently in school?: No Current Grade: n/a Highest grade of school patient has completed: 6th Name of school: n/a Contact person: n/a  Risk to self with the past 6 months Suicidal Ideation: Yes-Currently Present Has patient been a risk to self within the past 6 months prior to admission? : Yes Suicidal Intent: No Has patient had any suicidal intent within the past 6 months prior to admission? : Yes Is patient at risk for suicide?: Yes Suicidal Plan?: No-Not Currently/Within Last 6 Months Has patient had any suicidal plan within the past 6 months prior to admission? : Yes Specify Current Suicidal Plan:  Cut wrist Access to Means: Yes Specify Access to Suicidal Means: Knife What has been your use of drugs/alcohol within the last 12 months?: Reports of none Previous Attempts/Gestures: Yes How many times?: 4 Other Self Harm Risks: Reports of none Triggers for Past Attempts: None known Intentional Self Injurious Behavior:  None Family Suicide History: Yes (Per records, his uncle) Recent stressful life event(s): Other (Comment) (Paranoia) Persecutory voices/beliefs?: No Depression: Yes Depression Symptoms: Isolating, Fatigue, Guilt, Feeling worthless/self pity Substance abuse history and/or treatment for substance abuse?: No Suicide prevention information given to non-admitted patients: Not applicable  Risk to Others within the past 6 months Homicidal Ideation: No Does patient have any lifetime risk of violence toward others beyond the six months prior to admission? : No Thoughts of Harm to Others: No Comment - Thoughts of Harm to Others: Reports of none Current Homicidal Intent: No Current Homicidal Plan: No Access to Homicidal Means: No Describe Access to Homicidal Means: Reports of none Identified Victim: Reports of none History of harm to others?: No Assessment of Violence: None Noted Violent Behavior Description: Reports of none Does patient have access to weapons?: No Criminal Charges Pending?: No Does patient have a court date: No Is patient on probation?: No  Psychosis Hallucinations: None noted Delusions:  (Paranoia)  Mental Status Report Appearance/Hygiene: Unremarkable, In scrubs Eye Contact: Fair Motor Activity: Freedom of movement, Unremarkable Speech: Logical/coherent Level of Consciousness: Alert Mood: Depressed, Anxious, Fearful, Helpless, Sad, Pleasant Affect: Appropriate to circumstance, Depressed, Fearful Anxiety Level: Moderate Thought Processes: Coherent, Relevant Judgement: Partial Orientation: Person, Place, Time, Situation Obsessive Compulsive Thoughts/Behaviors: Moderate  Cognitive Functioning Concentration: Normal Memory: Recent Intact, Remote Intact IQ: Average Insight: Fair Impulse Control: Fair Appetite: Good Weight Loss: 0 Weight Gain: 0 Sleep: No Change Total Hours of Sleep: 8 Vegetative Symptoms: None  ADLScreening [facility] Assessment  Services) Patient's cognitive ability adequate to safely complete daily activities?: Yes Patient able to express need for assistance with ADLs?: Yes Independently performs ADLs?: Yes (appropriate for developmental age)  Prior Inpatient Therapy Prior Inpatient Therapy: Yes Prior Therapy Dates: 09/2010, 07/2010, 03/2010, 02/2010 Prior Therapy Facilty/Provider(s): [facility] Reason for Treatment: Depression, Bipolar and SI  Prior Outpatient Therapy Prior Outpatient Therapy: No Prior Therapy Dates: Reports of none Prior Therapy Facilty/Provider(s): Reports of none Reason for Treatment: Reports of none Does patient have an ACCT team?: No Does patient have Intensive In-House Services?  : No Does patient have [facility] services? : No Does patient have P4CC services?: No  ADL Screening (condition at time of admission) Patient's cognitive ability adequate to safely complete daily activities?: Yes Is the patient deaf or have difficulty hearing?: No Does the patient have difficulty seeing, even when wearing glasses/contacts?: No Does the patient have difficulty concentrating, remembering, or making decisions?: No Patient able to express need for assistance with ADLs?: Yes Does the patient have difficulty dressing or bathing?: No Independently performs ADLs?: Yes (appropriate for developmental age) Does the patient have difficulty walking or climbing stairs?: No Weakness of Legs: None Weakness of Arms/Hands: None  Home Assistive Devices/Equipment Home Assistive Devices/Equipment: None  Therapy Consults (therapy consults require a physician order) PT Evaluation Needed: No OT Evalulation Needed: No SLP Evaluation Needed: No Abuse/Neglect Assessment (Assessment to be complete while patient is alone) Physical Abuse: Denies Verbal Abuse: Denies Sexual Abuse: Denies Exploitation of patient/patient's resources: Denies Self-Neglect: Denies Values / Beliefs Cultural Requests During  Hospitalization: None Spiritual Requests During Hospitalization: None Consults Spiritual Care Consult Needed: No Social Work Consult Needed: No  Advance Directives (For Healthcare) Does Patient Have a Medical Advance Directive?: No Would patient like information on creating a medical advance directive?: Yes (Inpatient - patient requests chaplain consult to create a medical advance directive), No - Patient declined    Additional Information 1:1 In Past 12 Months?: No CIRT Risk: No Elopement Risk: No Does patient have medical clearance?: Yes  Child/Adolescent Assessment Running Away Risk: Denies (Patient is an adult)  Disposition:  Disposition Initial Assessment Completed for this Encounter: Yes Disposition of Patient: Other dispositions (ER MD Ordered Psych Consult)  On Site Evaluation by:   Reviewed with Physician:    Lilyan Gilford MS, LCAS, LPC, NCC, CCSI Therapeutic Triage Specialist 01/11/2017 7:29 PM

## 2017-01-11 NOTE — Clinical Social Work Note (Signed)
CSW received consult for "He does not like his group home - reports he has been living there about six weeks. [facility]. [facility] out of [place] - [place]." CSW made Silas Flood [phone]) aware. CSW staffed with psychiatrist and TTS. Pt to be referred to [facility]. CSW signing off as no further Social Work needs identified.   Corlis Hove, LCSWA, LCASA Clinical Social Worker-ED [phone]

## 2017-01-11 NOTE — ED Notes (Signed)
BEHAVIORAL HEALTH ROUNDING Patient sleeping: No. Patient alert and oriented: yes Behavior appropriate: Yes.  ; If no, describe:  Nutrition and fluids offered: yes Toileting and hygiene offered: Yes  Sitter present: q15 minute observations and security  monitoring Law enforcement present: Yes  ODS  

## 2017-01-11 NOTE — ED Notes (Signed)
Pt dress out by this RN and Tobi Bastos, Charity fundraiser.

## 2017-01-11 NOTE — ED Notes (Signed)
PT  VOL  SEEN  BY  DR  CLAPACS  PENDING  PLACEMENT 

## 2017-01-11 NOTE — ED Provider Notes (Signed)
[facility] Emergency Department Provider Note       Time seen: ----------------------------------------- 12:54 PM on 01/11/2017 -----------------------------------------     I have reviewed the triage vital signs and the nursing notes.   HISTORY   Chief Complaint Hip Pain and Fall   HPI Reginald Blair is a 71 y.o. male who presents to the ED for bilateral hip pain as well as wanting to harm himself. Patient presents from a group home and states he does not like it there and states if he goes back there is can harm himself and possibly others. Patient reports they're taking his money, he's had multiple falls recently.  Past Medical History:  Diagnosis Date  . Anemia   . Cerebrovascular disease   . Constipation   . COPD (chronic obstructive pulmonary disease) (HCC)   . Coronary artery disease   . Dry eye   . Dyslipidemia   . GERD (gastroesophageal reflux disease)   . Hypertension   . Major depressive disorder   . Major neurocognitive disorder   . Osteoarthritis   . Renal disorder   . Seizure (HCC)   . Thrombocytopenia (HCC)     There are no active problems to display for this patient.   History reviewed. No pertinent surgical history.  Allergies Patient has no known allergies.  Social History Social History  Substance Use Topics  . Smoking status: Former Games developer  . Smokeless tobacco: Never Used  . Alcohol use No    Review of Systems Constitutional: Negative for fever. Eyes: Negative for vision changes ENT:  Negative for congestion, sore throat Cardiovascular: Negative for chest pain. Respiratory: Negative for shortness of breath. Gastrointestinal: Negative for abdominal pain, vomiting and diarrhea. Genitourinary: Negative for dysuria. Musculoskeletal: positive for extremity pain Skin: Negative for rash. Neurological: Negative for headaches, focal weakness or numbness. Psychiatric: positive for suicidal and homicidal  ideation  All systems negative/normal/unremarkable except as stated in the HPI  ____________________________________________   PHYSICAL EXAM:  VITAL SIGNS: ED Triage Vitals  Enc Vitals Group     BP 01/11/17 1126 110/69     Pulse Rate 01/11/17 1126 90     Resp 01/11/17 1126 18     Temp 01/11/17 1126 98.3 F (36.8 C)     Temp Source 01/11/17 1126 Oral     SpO2 01/11/17 1124 95 %     Weight 01/11/17 1127 160 lb (72.6 kg)     Height 01/11/17 1127  (1.753 m)     Head Circumference --      Peak Flow --      Pain Score 01/11/17 1126 10     Pain Loc --      Pain Edu? --      Excl. in GC? --    Constitutional: Alert and oriented. no distress Eyes: Conjunctivae are normal. Normal extraocular movements. ENT   Head: Normocephalic and atraumatic.   Nose: No congestion/rhinnorhea.   Mouth/Throat: Mucous membranes are moist.   Neck: No stridor. Cardiovascular: Normal rate, regular rhythm. No murmurs, rubs, or gallops. Respiratory: Normal respiratory effort without tachypnea nor retractions. Breath sounds are clear and equal bilaterally. No wheezes/rales/rhonchi. Gastrointestinal: Soft and nontender. Normal bowel sounds Musculoskeletal: Nontender with normal range of motion in extremities. No lower extremity tenderness nor edema. Neurologic:  Normal speech and language. No gross focal neurologic deficits are appreciated.  Skin:  Skin is warm, dry and intact. No rash noted. Psychiatric: Mood and affect are normal. Speech and behavior are normal.  ____________________________________________  ED COURSE:  Pertinent labs & imaging results that were available during my care of the patient were reviewed by me and considered in my medical decision making (see chart for details). Patient presents for suicidal and possibly homicidal ideation, we will assess with labs and imaging as indicated.   Procedures ____________________________________________   LABS (pertinent  positives/negatives)  Labs Reviewed - No data to display  ____________________________________________  DIFFERENTIAL DIAGNOSIS   suicidal ideation, homicidal ideation, schizophrenia, depression, substance abuse   FINAL ASSESSMENT AND PLAN  suicidal ideation   Plan: Patient's labs were dictated above. Patient had presented for suicidal possibly homicidal follow-up. He appears medically stable for psychiatric and possibly social worker evaluation and disposition.   Emily Filbert, MD   Note: This note was generated in part or whole with voice recognition software. Voice recognition is usually quite accurate but there are transcription errors that can and very often do occur. I apologize for any typographical errors that were not detected and corrected.     Emily Filbert, MD 01/11/17 1257

## 2017-01-11 NOTE — ED Notes (Signed)
Upon chart review, noted EKG ordered earlier today but not completed; MD notified and EKG performed

## 2017-01-11 NOTE — ED Notes (Signed)
Pt given graham crackers and orange juice. No other needs voiced at this time.

## 2017-01-11 NOTE — ED Notes (Signed)

## 2017-01-11 NOTE — ED Provider Notes (Signed)
-----------------------------------------   10:35 PM on 01/11/2017 -----------------------------------------  ED ECG REPORT I, Dionne Bucy, the attending physician, personally viewed and interpreted this ECG.  Date: 01/11/2017 EKG Time: 2234 Rate: 79 Rhythm: normal sinus rhythm QRS Axis: normal Intervals: normal ST/T Wave abnormalities: normal Narrative Interpretation: no evidence of acute ischemia    Dionne Bucy, MD 01/11/17 2236

## 2017-01-11 NOTE — ED Notes (Signed)
Pt given lunch tray and milk 

## 2017-01-11 NOTE — ED Notes (Signed)
Pt ambulatory from 14 to [facility], noted to be somewhat unsteady however does not complain of any hip pain with ambulation.

## 2017-01-11 NOTE — Progress Notes (Signed)
Clozapine monitoring ANC 15.8. Lab entered and patient eligible per [facility] website.  Fulton Reek, PharmD, BCPS  01/11/17 11:21 PM

## 2017-01-11 NOTE — ED Triage Notes (Signed)
Pt presents to ED via [facility] from [facility] group home. Per EMS pt is c.o bilateral hip pain, states twisted and felt a pop and now c/o bilateral hip pain worse on the right. Pt maintains full ROM and is able to lift both legs and rotate his legs without pain upon arrival. Pt also c/o SI with a plan to stab him, pt is however noted to be disoriented to the time. Pt repeatedly states to this RN that he does not want to go back to his group home and that they are taking his money. EMS reports multiple falls at this time. Pt has legal guardian, [facility] guardianship. While this RN in room pt attempted to jump up out of bed and leave, this RN convinced patient to remain in bed, charge RN notified and patient moved to [facility].

## 2017-01-12 DIAGNOSIS — M7918 Myalgia, other site: Secondary | ICD-10-CM | POA: Diagnosis not present

## 2017-01-12 MED ORDER — BUDESONIDE 0.25 MG/2ML IN SUSP
0.2500 mg | Freq: Two times a day (BID) | RESPIRATORY_TRACT | Status: DC
  Administered 2017-01-13 – 2017-01-17 (×8): 0.25 mg via RESPIRATORY_TRACT
  Filled 2017-01-12 (×8): qty 2

## 2017-01-12 MED ORDER — TAMSULOSIN HCL 0.4 MG PO CAPS
0.4000 mg | ORAL_CAPSULE | Freq: Every day | ORAL | Status: DC
  Administered 2017-01-12 – 2017-01-17 (×6): 0.4 mg via ORAL
  Filled 2017-01-12 (×6): qty 1

## 2017-01-12 MED ORDER — POLYVINYL ALCOHOL 1.4 % OP SOLN
2.0000 [drp] | Freq: Four times a day (QID) | OPHTHALMIC | Status: DC
  Administered 2017-01-12 – 2017-01-17 (×16): 2 [drp] via OPHTHALMIC
  Filled 2017-01-12: qty 15

## 2017-01-12 MED ORDER — FLUTICASONE PROPIONATE HFA 110 MCG/ACT IN AERO: 1.0000 | INHALATION_SPRAY | Freq: Two times a day (BID) | RESPIRATORY_TRACT | Status: DC

## 2017-01-12 MED ORDER — MELATONIN 5 MG PO TABS
5.0000 mg | ORAL_TABLET | Freq: Every day | ORAL | Status: DC
  Administered 2017-01-12 – 2017-01-16 (×5): 5 mg via ORAL
  Filled 2017-01-12 (×8): qty 1

## 2017-01-12 MED ORDER — PREDNISOLONE ACETATE 1 % OP SUSP
1.0000 [drp] | Freq: Four times a day (QID) | OPHTHALMIC | Status: DC
  Administered 2017-01-12 – 2017-01-17 (×16): 1 [drp] via OPHTHALMIC
  Filled 2017-01-12 (×5): qty 5

## 2017-01-12 MED ORDER — ARTIFICIAL TEARS OPHTHALMIC OINT
1.0000 "application " | TOPICAL_OINTMENT | Freq: Every day | OPHTHALMIC | Status: DC
  Administered 2017-01-12 – 2017-01-16 (×5): 1 via OPHTHALMIC
  Filled 2017-01-12: qty 3.5

## 2017-01-12 MED ORDER — FOLIC ACID 1 MG PO TABS
1.0000 mg | ORAL_TABLET | Freq: Every day | ORAL | Status: DC
  Administered 2017-01-12 – 2017-01-17 (×6): 1 mg via ORAL
  Filled 2017-01-12 (×6): qty 1

## 2017-01-12 NOTE — ED Notes (Signed)
BEHAVIORAL HEALTH ROUNDING Patient sleeping: Yes.   Patient alert and oriented: eyes closed  Appears asleep Behavior appropriate: Yes.  ; If no, describe:  Nutrition and fluids offered: Yes  Toileting and hygiene offered: sleeping Sitter present: q 15 minute observations and security camera monitoring Law enforcement present: yes  ODS 

## 2017-01-12 NOTE — ED Notes (Signed)
Breakfast tray placed at head of bed. Pt sleeping

## 2017-01-12 NOTE — ED Notes (Signed)
Pt given lunch tray.

## 2017-01-12 NOTE — ED Notes (Signed)
Pt lying asleep in bed in no distress. Pt states that he does not want anything to eat, only a chocolate milk. Pt states that the food was too hard. I offered the pt a softer meal but he states that he's not that hungry. RN notified

## 2017-01-12 NOTE — ED Notes (Signed)

## 2017-01-12 NOTE — ED Notes (Signed)
Resumed care from [place], [place]. Pt resting comfortably; NAD noted.

## 2017-01-12 NOTE — BHH Counselor (Signed)
Referral information for Psychiatric Hospitalization faxed to;     Reginald Blair [phone]),    [facility] [phone] reviewed   Reginald Blair [phone] or [phone]),    Reginald Blair [phone]),

## 2017-01-12 NOTE — ED Notes (Signed)
BEHAVIORAL HEALTH ROUNDING Patient sleeping: YES Patient alert and oriented: SLEEPING Behavior appropriate: SLEEPING Nutrition and fluids offered: SLEEPING Toileting and hygiene offered: SLEEPING Sitter present: YES Law enforcement present: YES 

## 2017-01-12 NOTE — ED Notes (Signed)
Patient in darkened room, lying on bed with eyes closed; appears to be resting comfortably, sleeping soundly. Shows no s/sx of acute distress. Even, unlabored resp noted.Will continue to monitor.Call light in reach and environment secured with stretcher locked in lowest position 

## 2017-01-12 NOTE — ED Notes (Signed)
Pt ambulated to restroom with no difficulty.

## 2017-01-12 NOTE — BH Assessment (Signed)
Clinician f/u on submitted placement referrals:   Per Shirlee Limerick, pt has been declined at [facility]. Representative states pt has "already met his therapeutic goal here."  [facility])- no beds  [facility])- no beds  [facility] denied due to medical acuity  TTS will continue seeking placement.

## 2017-01-12 NOTE — ED Provider Notes (Signed)
-----------------------------------------   6:09 AM on 01/12/2017 -----------------------------------------   Blood pressure 92/68, pulse 89, temperature 98 F (36.7 C), temperature source Oral, resp. rate 20, height  (1.753 m), weight 72.6 kg (160 lb), SpO2 93 %.  The patient had no acute events since last update.  Sleeping at this time.  Disposition is pending Psychiatry/Behavioral Medicine team recommendations.     Irean Hong, MD 01/12/17 [phone]

## 2017-01-12 NOTE — ED Notes (Signed)
Patient in hallway, lying on bed with eyes closed; appears to be resting comfortably, sleeping soundly. Shows no s/sx of acute distress. Even, unlabored resp noted.Will continue to monitor.Call light in reach and environment secured with stretcher locked in lowest position 

## 2017-01-12 NOTE — ED Notes (Signed)

## 2017-01-12 NOTE — ED Notes (Signed)
Pt ambulatory in hallway to BR; no difficulty or distress noted

## 2017-01-12 NOTE — ED Notes (Signed)
Pt awakened to take medication and eat breakfast. Pt took medication, but did not eat. Pt encouraged to eat.

## 2017-01-13 DIAGNOSIS — M7918 Myalgia, other site: Secondary | ICD-10-CM | POA: Diagnosis not present

## 2017-01-13 DIAGNOSIS — F201 Disorganized schizophrenia: Secondary | ICD-10-CM | POA: Diagnosis not present

## 2017-01-13 MED ORDER — LEVETIRACETAM 500 MG PO TABS
ORAL_TABLET | ORAL | Status: AC
  Filled 2017-01-13: qty 2

## 2017-01-13 NOTE — ED Provider Notes (Signed)
-----------------------------------------   7:34 AM on 01/13/2017 -----------------------------------------  Vitals:   01/12/17 2211 01/13/17 0630  BP: 122/70 107/68  Pulse: 76 82  Resp: 15 18  Temp: (!) 97.5 F (36.4 C)   SpO2: 97% 91%   No acute events reported to me overnight.  Awaiting geri-psych placement.  Patient resting comfortably this morning.   Governor Rooks, MD 01/13/17 [phone]

## 2017-01-13 NOTE — ED Notes (Signed)
Pt given supper tray.

## 2017-01-13 NOTE — ED Notes (Signed)

## 2017-01-13 NOTE — ED Notes (Signed)
ED Is the patient under IVC or is there intent for IVC:  voluntary Is the patient medically cleared: Yes.   Is there vacancy in the ED BHU: Yes.   Is the population mix appropriate for patient: Yes.   Is the patient awaiting placement in inpatient or outpatient setting:  Geriatric psych placement  Has the patient had a psychiatric consult: Yes.   Survey of unit performed for contraband, proper placement and condition of furniture, tampering with fixtures in bathroom, shower, and each patient room: Yes.  ; Findings:  APPEARANCE/BEHAVIOR Calm and cooperative NEURO ASSESSMENT Orientation: oriented to self and place   Denies pain Hallucinations:   Verbalizes hearing voices  Speech: Normal Gait: normal RESPIRATORY ASSESSMENT Even  Unlabored respirations  CARDIOVASCULAR ASSESSMENT Pulses equal   regular rate  Skin warm and dry   GASTROINTESTINAL ASSESSMENT no GI complaint EXTREMITIES Full ROM  PLAN OF CARE Provide calm/safe environment. Vital signs assessed twice daily. ED BHU Assessment once each 12-hour shift. Collaborate with  tts daily or as condition indicates. Assure the ED provider has rounded once each shift. Provide and encourage hygiene. Provide redirection as needed. Assess for escalating behavior; address immediately and inform ED provider.  Assess family dynamic and appropriateness for visitation as needed: Yes.  ; If necessary, describe findings:  Educate the patient/family about BHU procedures/visitation: Yes.  ; If necessary, describe findings:

## 2017-01-13 NOTE — ED Notes (Signed)

## 2017-01-13 NOTE — ED Notes (Signed)
BEHAVIORAL HEALTH ROUNDING Patient sleeping: No. Patient alert and oriented: yes Behavior appropriate: Yes.  ; If no, describe:  Nutrition and fluids offered: yes Toileting and hygiene offered: Yes  Sitter present: q15 minute observations and security  monitoring Law enforcement present: Yes  ODS  

## 2017-01-13 NOTE — ED Notes (Signed)
BEHAVIORAL HEALTH ROUNDING Patient sleeping: Yes.   Patient alert and oriented: eyes closed  Appears asleep Behavior appropriate: Yes.  ; If no, describe:  Nutrition and fluids offered: Yes  Toileting and hygiene offered: sleeping Sitter present: q 15 minute observations and security monitoring Law enforcement present: yes  ODS 

## 2017-01-13 NOTE — ED Notes (Signed)
Pt given lunch tray.

## 2017-01-13 NOTE — ED Notes (Signed)
Patient observed lying in bed with eyes closed  Even, unlabored respirations observed   NAD pt appears to be sleeping  I will continue to monitor along with every 15 minute visual observations and ongoing security monitoring    

## 2017-01-13 NOTE — Consult Note (Signed)
[facility] Face-to-Face Psychiatry Consult   Reason for Consult:  Consult for 71 year old man with a history of chronic mental illness who is claiming suicidal ideation Referring Physician:  Mayford Knife Patient Identification: Reginald Blair MRN:  [id number] Principal Diagnosis: Schizophrenia [facility]) Diagnosis:   Patient Active Problem List   Diagnosis Date Noted  . Schizophrenia (HCC) [F20.9] 01/11/2017  . Hypertension [I10] 01/11/2017  . Osteoarthritis [M19.90] 01/11/2017    Total Time spent with patient: 20 minutes  Subjective:   Reginald Blair is a 71 y.o. male patient admitted with "don't send me back to that place".  This is a follow-up for this 71 year old man who has been in the emergency room now for a couple of days. We have been trying to refer him for inpatient psychiatric treatment because of his behavior around going back to his group home but have been unable to find a placement. On reevaluation today I find that the patient has had no behavior problems. He denies actually wanting to die and has not been aggressive. His behavior problems seemed all revolve around just not wanting to go back to the previous group home. Not really able to give a clear reason for that. Patient does not have a history of serious dangerousness in the past. At this point I think that on reassessment he probably does not need hospital treatment.  HPI:  Patient seen chart reviewed. 71 year old man with a history of chronic mental illness came to the hospital apparently initially for complaints of falls or arthritis or joint pain. Started talking about how people at his group home were dangerous and he wanted to kill himself. Patient tells me a very incoherent story. He says that he had a something or other in the legs that he can't remember the name of and the people at the group home did something or other to him. Literally doesn't make any sense. Then he announces that he will kill himself if he has to go back  there because the people are trying to hurt him. Looks very panicky and anxious. Denies any substance abuse. Says that he's been compliant with his medicine. Begs me repeatedly not to send him back to his group home.  Social history: Apparently lives at a local group home. Has a guardian. Rest of the details are unclear.  Medical history: Multiple medical problems including high blood pressure coronary artery disease COPD  Substance abuse history: Past history of alcohol abuse. Says that he does not drink anymore. Denies other drug abuse.  Past Psychiatric History: Patient has been diagnosed in the past with schizophrenia as well as cognitive disorder probably dementia. History of depression. He does have a history of suicide attempts by cutting.  Risk to Self: Suicidal Ideation: Yes-Currently Present Suicidal Intent: No Is patient at risk for suicide?: Yes Suicidal Plan?: No-Not Currently/Within Last 6 Months Specify Current Suicidal Plan: Cut wrist Access to Means: Yes Specify Access to Suicidal Means: Knife What has been your use of drugs/alcohol within the last 12 months?: Reports of none How many times?: 4 Other Self Harm Risks: Reports of none Triggers for Past Attempts: None known Intentional Self Injurious Behavior: None Risk to Others: Homicidal Ideation: No Thoughts of Harm to Others: No Comment - Thoughts of Harm to Others: Reports of none Current Homicidal Intent: No Current Homicidal Plan: No Access to Homicidal Means: No Describe Access to Homicidal Means: Reports of none Identified Victim: Reports of none History of harm to others?: No Assessment of Violence: None  Noted Violent Behavior Description: Reports of none Does patient have access to weapons?: No Criminal Charges Pending?: No Does patient have a court date: No Prior Inpatient Therapy: Prior Inpatient Therapy: Yes Prior Therapy Dates: 09/2010, 07/2010, 03/2010, 02/2010 Prior Therapy Facilty/Provider(s):  [facility] Reason for Treatment: Depression, Bipolar and SI Prior Outpatient Therapy: Prior Outpatient Therapy: No Prior Therapy Dates: Reports of none Prior Therapy Facilty/Provider(s): Reports of none Reason for Treatment: Reports of none Does patient have an ACCT team?: No Does patient have Intensive In-House Services?  : No Does patient have [facility] services? : No Does patient have P4CC services?: No  Past Medical History:  Past Medical History:  Diagnosis Date  . Anemia   . Cerebrovascular disease   . Constipation   . COPD (chronic obstructive pulmonary disease) (HCC)   . Coronary artery disease   . Dry eye   . Dyslipidemia   . GERD (gastroesophageal reflux disease)   . Hypertension   . Major depressive disorder   . Major neurocognitive disorder   . Osteoarthritis   . Renal disorder   . Seizure (HCC)   . Thrombocytopenia (HCC)    History reviewed. No pertinent surgical history. Family History: History reviewed. No pertinent family history. Family Psychiatric  History: He does not know of any family history Social History:  History  Alcohol Use No     History  Drug Use No    Social History   Social History  . Marital status: Divorced    Spouse name: N/A  . Number of children: N/A  . Years of education: N/A   Social History Main Topics  . Smoking status: Former Games developer  . Smokeless tobacco: Never Used  . Alcohol use No  . Drug use: No  . Sexual activity: Not Asked   Other Topics Concern  . None   Social History Narrative  . None   Additional Social History:    Allergies:  No Known Allergies  Labs:  No results found for this or any previous visit (from the past 48 hour(s)).  Current Facility-Administered Medications  Medication Dose Route Frequency Provider Last Rate Last Dose  . artificial tears (LACRILUBE) ophthalmic ointment 1 application  1 application Both Eyes QHS Myrna Blazer, MD   1 application at 01/12/17 2213  . atorvastatin  (LIPITOR) tablet 10 mg  10 mg Oral q1800 Mekala Winger, Jackquline Denmark, MD   10 mg at 01/12/17 1933  . budesonide (PULMICORT) nebulizer solution 0.25 mg  0.25 mg Nebulization BID Myrna Blazer, MD   0.25 mg at 01/13/17 1127  . clopidogrel (PLAVIX) tablet 75 mg  75 mg Oral Daily Jerran Tappan, Jackquline Denmark, MD   75 mg at 01/13/17 1127  . cloZAPine (CLOZARIL) tablet 100 mg  100 mg Oral Daily Naseem Adler, Jackquline Denmark, MD   100 mg at 01/13/17 1129  . cloZAPine (CLOZARIL) tablet 200 mg  200 mg Oral QHS Raeli Wiens T, MD   200 mg at 01/12/17 2206  . divalproex (DEPAKOTE) DR tablet 1,000 mg  1,000 mg Oral QHS Adena Sima T, MD   1,000 mg at 01/11/17 2224  . docusate sodium (COLACE) capsule 100 mg  100 mg Oral BID Blaise Grieshaber, Jackquline Denmark, MD   100 mg at 01/13/17 1128  . folic acid (FOLVITE) tablet 1 mg  1 mg Oral Daily Schaevitz, Myra Rude, MD   1 mg at 01/13/17 1129  . levETIRAcetam (KEPPRA) tablet 750 mg  750 mg Oral BID Shaterria Sager, Jackquline Denmark, MD  750 mg at 01/13/17 1131  . Melatonin TABS 5 mg  5 mg Oral QHS Schaevitz, Myra Rude, MD   5 mg at 01/12/17 2214  . pantoprazole (PROTONIX) EC tablet 40 mg  40 mg Oral Daily Sanya Kobrin, Jackquline Denmark, MD   40 mg at 01/13/17 1132  . polyethylene glycol (MIRALAX / GLYCOLAX) packet 17 g  17 g Oral Daily Tayelor Osborne T, MD   17 g at 01/13/17 1133  . polyvinyl alcohol (LIQUIFILM TEARS) 1.4 % ophthalmic solution 2 drop  2 drop Both Eyes QID Myrna Blazer, MD   2 drop at 01/13/17 1135  . prednisoLONE acetate (PRED FORTE) 1 % ophthalmic suspension 1 drop  1 drop Right Eye QID Myrna Blazer, MD   1 drop at 01/13/17 1134  . tamsulosin (FLOMAX) capsule 0.4 mg  0.4 mg Oral Daily Myrna Blazer, MD   0.4 mg at 01/13/17 1130  . venlafaxine XR (EFFEXOR-XR) 24 hr capsule 150 mg  150 mg Oral Q breakfast Arabella Revelle, Jackquline Denmark, MD   150 mg at 01/13/17 1140   Current Outpatient Prescriptions  Medication Sig Dispense Refill  . Artificial Tear Ointment (SOOTHE NIGHT TIME) OINT Place 1  application into both eyes at bedtime. Apply a thin ribbon    . atorvastatin (LIPITOR) 10 MG tablet Take 10 mg by mouth at bedtime.    . clopidogrel (PLAVIX) 75 MG tablet Take 75 mg by mouth daily.    . cloZAPine (CLOZARIL) 100 MG tablet Take 100-200 mg by mouth 2 (two) times daily. 100 mg daily before breakfast and 200 mg daily at bedtime    . divalproex (DEPAKOTE ER) 500 MG 24 hr tablet Take 1,000 mg by mouth at bedtime.    . docusate sodium (COLACE) 100 MG capsule Take 100 mg by mouth 2 (two) times daily.    . fluticasone (FLOVENT HFA) 110 MCG/ACT inhaler Inhale 1 puff into the lungs 2 (two) times daily.    . folic acid (FOLVITE) 1 MG tablet Take 1 mg by mouth daily.    Marland Kitchen levETIRAcetam (KEPPRA) 750 MG tablet Take 750 mg by mouth 2 (two) times daily.    . Melatonin 3 MG TABS Take 3 mg by mouth at bedtime.    . pantoprazole (PROTONIX) 40 MG tablet Take 40 mg by mouth daily.    . polyethylene glycol (MIRALAX / GLYCOLAX) packet Take 17 g by mouth daily.    . polyvinyl alcohol (LIQUIFILM TEARS) 1.4 % ophthalmic solution Place 2 drops into both eyes 4 (four) times daily.    . prednisoLONE acetate (PRED FORTE) 1 % ophthalmic suspension Place 1 drop into the right eye 4 (four) times daily.    . tamsulosin (FLOMAX) 0.4 MG CAPS capsule Take 0.4 mg by mouth daily.    Marland Kitchen venlafaxine XR (EFFEXOR-XR) 150 MG 24 hr capsule Take 150 mg by mouth daily.    Marland Kitchen LORazepam (ATIVAN) 0.5 MG tablet Take 1 tablet (0.5 mg total) by mouth every 8 (eight) hours as needed for anxiety or sleep. (Patient not taking: Reported on 01/12/2017) 5 tablet 0    Musculoskeletal: Strength & Muscle Tone: within normal limits Gait & Station: normal Patient leans: N/A  Psychiatric Specialty Exam: Physical Exam  Nursing note and vitals reviewed. Constitutional: He appears well-developed and well-nourished.  HENT:  Head: Normocephalic and atraumatic.  Eyes: Pupils are equal, round, and reactive to light. Conjunctivae are normal.   Neck: Normal range of motion.  Cardiovascular: Regular rhythm and normal heart  sounds.   Respiratory: Effort normal. No respiratory distress.  GI: Soft.  Musculoskeletal: Normal range of motion.  Neurological: He is alert.  Skin: Skin is warm and dry.  Psychiatric: His mood appears anxious. His speech is tangential. He is not agitated and not aggressive. Thought content is paranoid. He expresses impulsivity. He exhibits a depressed mood. He expresses no suicidal ideation. He exhibits abnormal recent memory and abnormal remote memory.    Review of Systems  Constitutional: Negative.   HENT: Negative.   Eyes: Negative.   Respiratory: Negative.   Cardiovascular: Negative.   Gastrointestinal: Negative.   Musculoskeletal: Positive for joint pain.  Skin: Negative.   Neurological: Negative.   Psychiatric/Behavioral: Positive for memory loss. Negative for depression, hallucinations, substance abuse and suicidal ideas. The patient is nervous/anxious and has insomnia.     Blood pressure 107/68, pulse 82, temperature (!) 97.5 F (36.4 C), temperature source Oral, resp. rate 18, height  (1.753 m), weight 72.6 kg (160 lb), SpO2 91 %.Body mass index is 23.63 kg/m.  General Appearance: Disheveled  Eye Contact:  Fair  Speech:  Garbled  Volume:  Increased  Mood:  Anxious, Depressed and Dysphoric  Affect:  Congruent and Inappropriate  Thought Process:  Disorganized  Orientation:  Negative  Thought Content:  Illogical, Paranoid Ideation, Rumination and Tangential  Suicidal Thoughts:  No  Homicidal Thoughts:  No  Memory:  Immediate;   Fair Recent;   Poor Remote;   Poor  Judgement:  Poor  Insight:  Lacking  Psychomotor Activity:  Restlessness  Concentration:  Concentration: Poor  Recall:  Poor  Fund of Knowledge:  Poor  Language:  Poor  Akathisia:  No  Handed:  Right  AIMS (if indicated):     Assets:  Desire for Improvement Housing Social Support  ADL's:  Impaired  Cognition:   Impaired,  Moderate  Sleep:        Treatment Plan Summary: Daily contact with patient to assess and evaluate symptoms and progress in treatment, Medication management and Plan Patient is not currently endorsing thoughts of suicide or homicide. He has been calm and withdrawn but cooperative here in the emergency room. Not aggressive. Patient does not appear to be an aggressive danger to himself at this point. I think it would be reasonable to safely try having him discharge back to his group home. He is not under involuntary commitment. Case reviewed with ER physician. We will talk with social work and TTS and work on referral back to his home with outpatient follow-up.  Disposition: Patient does not meet criteria for psychiatric inpatient admission. Supportive therapy provided about ongoing stressors.  Mordecai Rasmussen, MD 01/13/2017 5:11 PM

## 2017-01-13 NOTE — ED Notes (Signed)
Pt given breakfast tray. Orange Juice opened for pt. Pt ate approx. 50% of breakfast biscuit.

## 2017-01-14 DIAGNOSIS — M7918 Myalgia, other site: Secondary | ICD-10-CM | POA: Diagnosis not present

## 2017-01-14 NOTE — BH Assessment (Signed)
Placed call to patient's group home, Guardian angel [facility] [phone]).  Informed group home that patient has been cleared by Dr. Toni Amend and is ready for discharge back to the group home.  Staff informed this Clinical research associate that patient's guardian Corwin Levins [phone]) will be called and advised of the patient's d/c plans.

## 2017-01-14 NOTE — ED Provider Notes (Signed)
No acute events overnight. Pending psychiatric disposition   Jene Every, MD 01/14/17 [phone]

## 2017-01-14 NOTE — ED Notes (Signed)
Pt was given meal tray.  

## 2017-01-15 DIAGNOSIS — M7918 Myalgia, other site: Secondary | ICD-10-CM | POA: Diagnosis not present

## 2017-01-15 NOTE — ED Provider Notes (Signed)
-----------------------------------------   6:55 AM on 01/15/2017 -----------------------------------------   Blood pressure (!) 157/84, pulse 78, temperature 98.8 F (37.1 C), temperature source Oral, resp. rate 18, height  (1.753 m), weight 72.6 kg (160 lb), SpO2 100 %.  The patient had no acute events since last update.  in reviewing the patient's records it appears that the patient has been cleared by psychiatry for discharge home. Patient group home has been made aware and there currently attempting to reach family. Anticipate likely discharged today.   Minna Antis, MD 01/15/17 [phone]

## 2017-01-15 NOTE — ED Notes (Signed)
VOL  PENDING  PLACEMENT 

## 2017-01-15 NOTE — ED Notes (Signed)
BEHAVIORAL HEALTH ROUNDING Patient sleeping: Yes.   Patient alert and oriented: not applicable SLEEPING Behavior appropriate: Yes.  ; If no, describe: SLEEPING Nutrition and fluids offered: No SLEEPING Toileting and hygiene offered: NoSLEEPING Sitter present: not applicable, Q 15 min safety rounds and observation. Law enforcement present: Yes ODS 

## 2017-01-15 NOTE — ED Notes (Signed)
PT  VOL/  PENDING  PLACEMENT 

## 2017-01-15 NOTE — ED Notes (Signed)
BEHAVIORAL HEALTH ROUNDING  Patient sleeping: No.  Patient alert and oriented: yes  Behavior appropriate: Yes. ; If no, describe:  Nutrition and fluids offered: Yes  Toileting and hygiene offered: Yes  Sitter present: not applicable, Q 15 min safety rounds and observation.  Law enforcement present: Yes ODS  ED BHU PLACEMENT JUSTIFICATION  Is the patient under IVC or is there intent for IVC: no.  Is the patient medically cleared: Yes.  Is there vacancy in the ED BHU: Yes.  Is the population mix appropriate for patient: Yes.  Is the patient awaiting placement in inpatient or outpatient setting: Yes.  Has the patient had a psychiatric consult: Yes.  Survey of unit performed for contraband, proper placement and condition of furniture, tampering with fixtures in bathroom, shower, and each patient room: Yes. ; Findings: All clear  APPEARANCE/BEHAVIOR  calm, cooperative and adequate rapport can be established  NEURO ASSESSMENT  Orientation: time, place and person  Hallucinations: No.None noted (Hallucinations)  Speech: Normal  Gait: normal  RESPIRATORY ASSESSMENT  WNL  CARDIOVASCULAR ASSESSMENT  WNL  GASTROINTESTINAL ASSESSMENT  WNL  EXTREMITIES  WNL  PLAN OF CARE  Provide calm/safe environment. Vital signs assessed TID. ED BHU Assessment once each 12-hour shift. Collaborate with TTS daily or as condition indicates. Assure the ED provider has rounded once each shift. Provide and encourage hygiene. Provide redirection as needed. Assess for escalating behavior; address immediately and inform ED provider.  Assess family dynamic and appropriateness for visitation as needed: Yes. ; If necessary, describe findings:  Educate the patient/family about BHU procedures/visitation: Yes. ; If necessary, describe findings: Pt is calm and cooperative at this time. Pt understanding and accepting of unit procedures/rules. Will continue to monitor with Q 15 min safety rounds and observation.

## 2017-01-15 NOTE — ED Notes (Signed)
Pt has been sleeping all afternoon. Was awakened to inform that his dinner had arrived but he put it on the floor and went back to sleep. Lm edt

## 2017-01-15 NOTE — ED Notes (Signed)
Gave Pt [place] snack trey and drink. Pt ate a few bites and drank his drink stated he did not want any more.AS

## 2017-01-15 NOTE — ED Notes (Signed)
Resumed care from [place], [place]. Pt resting quietly. NAD noted. Will continue to monitor.  Plan: TTS seeking placement.

## 2017-01-15 NOTE — ED Notes (Signed)
Pt went to restroom and said he was sick is to why he has been sleeping all day. Prompted to try and eat but refused.  Bed stripped and clean linen put on Lm edt

## 2017-01-15 NOTE — ED Notes (Signed)

## 2017-01-15 NOTE — ED Notes (Signed)
Pt became quite agitated at mention of returning to his group home [facility]) to the point of threatening to hurt self or others, assurances given that best course of action for pt would be found, TTS notified, pt will need placement to new group home

## 2017-01-16 DIAGNOSIS — M7918 Myalgia, other site: Secondary | ICD-10-CM | POA: Diagnosis not present

## 2017-01-16 MED ORDER — LEVETIRACETAM 500 MG PO TABS
ORAL_TABLET | ORAL | Status: AC
  Filled 2017-01-16: qty 2

## 2017-01-16 NOTE — ED Notes (Signed)
BEHAVIORAL HEALTH ROUNDING Patient sleeping: Yes.   Patient alert and oriented: not applicable SLEEPING Behavior appropriate: Yes.  ; If no, describe: SLEEPING Nutrition and fluids offered: No SLEEPING Toileting and hygiene offered: NoSLEEPING Sitter present: not applicable, Q 15 min safety rounds and observation. Law enforcement present: Yes ODS 

## 2017-01-16 NOTE — Clinical Social Work Note (Signed)
CSW was contacted by Raliegh Scarlet [facility] owner: Reginald Blair: [phone]. She apologized for not getting back to CSW sooner but she had been in church all day and was getting ready to return to church at 3pm. Reginald Blair stated that she had had much difficulty getting in touch with patient's guardian Reginald Blair ([employer] guardians[employer]) : [phone] and  [phone]. She did say that she was finally able to speak with him yesterday and that he had mentioned that he may need to look at another group home placement for patient. CSW asked Reginald Blair if she would be willing to take patient back and Reginald Blair stated that she would take patient back. She stated that patient had been fine and fitting in very well. She stated she found patient a primary care physician and was needing patient's guardian to assist with signing some papers but she could never get in touch with him. CSW informed her that CSW was having a difficult time reaching him as well. Also, SOC assessment still pending on patient.  Reginald Blair MSW,LCSW [phone]

## 2017-01-16 NOTE — ED Notes (Signed)
Pt up to restroom.

## 2017-01-16 NOTE — ED Notes (Signed)
Provided pt with warm blanket

## 2017-01-16 NOTE — ED Notes (Signed)
Called soc spoke to [facility]  for correction of report, they will correct and send corrected copy  [id number]

## 2017-01-16 NOTE — Clinical Social Work Note (Signed)
CSW called patient's nurse this afternoon and was informed that [facility] evaluated patient as CSW requested and they have IVC'd patient and are recommending In Patient Psych Hospitalization now. I have notified patient's guardian: Rosine Abe at [phone] and patient's' current family care home: [facility] owner: Erie Noe of this latest information. [name] MSW,LCSW [phone]

## 2017-01-16 NOTE — ED Provider Notes (Signed)
-----------------------------------------   6:32 AM on 01/16/2017 -----------------------------------------   Blood pressure (!) 142/91, pulse 70, temperature 97.8 F (36.6 C), temperature source Oral, resp. rate 20, height  (1.753 m), weight 72.6 kg (160 lb), SpO2 95 %.  The patient had no acute events since last update.  Sleeping at this time. Asked by TTS to place clinical social work consult for placement as patient does not wish to return to his group home.    Irean Hong, MD 01/16/17 [phone]

## 2017-01-16 NOTE — Clinical Social Work Note (Signed)
Patient has been in the ED since 10/2. I am told by TTS representative this morning that multiple inpatient psych hospitals were looked into but would not take patient and that Dr. Toni Amend with psychiatry then cleared patient psychiatrically on 10/4 . It appears a consult for group home placement was then made to TTS on 10/6 due to patient becoming agitated and " harmful to self and others" and needing new group home placement.  Thus, as documented in patient record that TTS then notified the ED physician this morning, 10/7 to contact CSW for new placement.  Guardian [facility] Care Home: Erie Noe is owner: [phone] and [phone]). Patient is also listed as having a guardian: Rosine Abe: [phone] and [phone]. CSW has left messages for both at all numbers provided.  Due to patient being documented by RN , Danelle Earthly that patient became "quite agitated" to the point of "threatening to hurt self or others" on the morning of 10/6,  this CSW is recommending psychiatry be re-consulted to evaluate for need for inpatient psychiatric treatment. Patient has a guardian and thus the patient has been deemed incompetent to make decisions for himself which includes whether or not he wishes to return to a group home or not. [name] MSW,MPH [phone]

## 2017-01-16 NOTE — Clinical Social Work Note (Addendum)
4:00: CSW contacted by ED Physician: Dr. Derrill Kay. He  wanted to know what my thoughts were in requesting another psych evaluation this morning.  He stated that this might be more of a placement issue with patient not wanting to return to his group home.   CSW explained that there was concern regarding patient's nurses' documentation from yesterday morning regarding patient behaving as though he would be a harm to self and others and that this warranted another evaluation. CSW informed the physician that patient has been deemed incompetent in a court of law and that the decision as to which group home patient goes to is his guardian's decision.  CSW informed the physician that the guardian stated he was in agreement with him returning to [place] at [facility] while he looked into another group home and Erie Noe stated she was in agreement with patient returning if medically and psychiatrically cleared. CSW also advised that the emergency room was not the appropriate place for a patient to be so that the patient could await another placement and that the guardian can work on placement as an outpatient. CSW also advised that patient's guardian was contacted by this CSW and notified that the recommendation was for patient to be involuntarily committed and psych inpatient to be pursued.  [name] MSW,LCSW [phone]   4:14: CSW contacted by Dr. Derrill Kay to inform CSW that he had spoken to the psychiatrist and that the recommendation for IVC and inpatient psych was in case we could not find him a group home.Dr. Derrill Kay stated that patient would not be IVC"d now and that psych hospitalization would not be pursued.   CSW explained that the current group home states that they will take him back but then Dr. Derrill Kay states he spoke with the guardian and was told that they wouldn't take him back. This was very different information than was provided to CSW just a little while earlier. As a result, CSW  contacted Mr. Susette Racer, the guardian again and he alleges that he told the ED MD that he spoke with the group home on Friday and she she had a concern at that time she might not be able to handle him. The guardian agreed that patient could return to the group home while he looked for placement as an outpatient. Nonetheless, Dr. Derrill Kay informed CSW that he was adamant that he would not allow patient to return to the current group home and that another group home would have to be sought by CSW.  CSW has updated CSW Director, [employer] regarding the above.

## 2017-01-16 NOTE — ED Notes (Signed)
Called SOC. 

## 2017-01-16 NOTE — Clinical Social Work Note (Signed)
CSW was finally able to reach patient's guardian with [employer]: Rosine Abe (at the [phone])  and he is in agreement with discharging patient back to [place] and stated he may have another place in [place] in the works but that patient can return to if medically and psychiatrically cleared to [place] in the interim. Currently awaiting to hear back from [facility] and their determination. [name] MSW,LCSW [phone]

## 2017-01-16 NOTE — ED Provider Notes (Signed)
-----------------------------------------   3:57 PM on 01/16/2017 -----------------------------------------  Patient is a see report has returned. Unfortunately initial report had some factual inaccuracies so a corrected report is being sent. However the initial report states that patient meets IVC criteria. On my exam of the patient he states that he has not current intention of harming himself. States that if he has to go back to group home he would consider it. Additionally states that he might hurt others at his group home if they attacked him first. I asked him to clarify and he states that he would not initiate any action against others. Patient does not appear to be in acute danger to himself or others. Will try to contact guardian.   ----------------------------------------- 4:10 PM on 01/16/2017 -----------------------------------------  I have now discussed with the guardian. The guardian states that the group home was not going to take the patient back and he is okay with the patient staying here in the emergency department. Additionally he states that he is working on potentially getting him a group home tomorrow. I have additionally spoken to Dr. Maricela Bo who stated that his concern is only site specific and that he does not feel the patient should return to his original group home. He states that he only recommended IVC because he thought it was required if the patient was to require an psychiatric admission to be moved out of the emergency department. However he thinks it is reasonable to try to place patient to new group home, which might happen tomorrow.      Phineas Semen, MD 01/16/17 [phone]

## 2017-01-17 DIAGNOSIS — F201 Disorganized schizophrenia: Secondary | ICD-10-CM | POA: Diagnosis not present

## 2017-01-17 DIAGNOSIS — M7918 Myalgia, other site: Secondary | ICD-10-CM | POA: Diagnosis not present

## 2017-01-17 NOTE — ED Notes (Signed)
Pt discharged to [place] at [facility] group home, no issues with discharge

## 2017-01-17 NOTE — ED Notes (Signed)
Patient states he will not go to group home. States he will go to jail before he goes to group home.

## 2017-01-17 NOTE — ED Notes (Signed)
BEHAVIORAL HEALTH ROUNDING Patient sleeping: Yes.   Patient alert and oriented: not applicable Behavior appropriate: Yes.  ; If no, describe:  Nutrition and fluids offered: Yes  Toileting and hygiene offered: Yes  Sitter present: not applicable Law enforcement present: Yes  

## 2017-01-17 NOTE — ED Notes (Signed)

## 2017-01-17 NOTE — ED Notes (Signed)
BEHAVIORAL HEALTH ROUNDING Patient sleeping: No. Patient alert and oriented: yes Behavior appropriate: Yes.  ; If no, describe:  Nutrition and fluids offered: Yes  Toileting and hygiene offered: Yes  Sitter present: not applicable Law enforcement present: Yes  

## 2017-01-17 NOTE — Clinical Social Work Note (Addendum)
CSW staffed case with Social Work Asst. Land. CSW and Edwyna Ready spoke with pt's guardian Reginald Blair [phone] and explained pt will return to [place], while [name] is seeking new placement for pt. [name]  states he will call Ms. Reginald Blair to arrange discharge.   CSW followed up with Ms. Reginald Blair [phone]) at [facility], who stated she'd spoken with [name] earlier and will accept pt back. Ms. Reginald Blair states she will pick up pt between 5pm and 6pm today. CSW met with pt at bedside to provide update of discharge and pt continuing to refuse returning to this [facility]. Pt hard to understand at some points, but has a guardian due to incapacity to make his own decisions. Pt's legal guardian is agreeable to discharge plan, as well as facility. CSW made RN, Agricultural consultant, MD, and AD Zack aware of discharge plan and barriers at discharge and asked that security be involved. CSW signing off as no further Social Work needs identified.   Reginald Blair, Reginald Blair, Reginald Blair Social Worker-ED [phone]

## 2017-01-17 NOTE — Consult Note (Signed)
[facility] Face-to-Face Psychiatry Consult   Reason for Consult:  Consult for 71 year old man with a history of chronic mental illness who is claiming suicidal ideation Referring Physician:  Mayford Knife Patient Identification: Reginald Blair MRN:  [id number] Principal Diagnosis: Schizophrenia [facility]) Diagnosis:   Patient Active Problem List   Diagnosis Date Noted  . Schizophrenia (HCC) [F20.9] 01/11/2017  . Hypertension [I10] 01/11/2017  . Osteoarthritis [M19.90] 01/11/2017    Total Time spent with patient: 20 minutes  Subjective:   Reginald Blair is a 71 y.o. male patient admitted with "don't send me back to that place".  71 year old man. Updated evaluation. Patient is calm and cooperative in the emergency room. When confronted with the idea of going back to his group home will still occasionally make some threatening statements but has not shown any tendency to carry through on violence or to hurt himself and does not appear to have any true wish to die. As noted previously I still do not think patient is at an elevated risk of dangerousness that would meet commitment criteria.  HPI:  Patient seen chart reviewed. 71 year old man with a history of chronic mental illness came to the hospital apparently initially for complaints of falls or arthritis or joint pain. Started talking about how people at his group home were dangerous and he wanted to kill himself. Patient tells me a very incoherent story. He says that he had a something or other in the legs that he can't remember the name of and the people at the group home did something or other to him. Literally doesn't make any sense. Then he announces that he will kill himself if he has to go back there because the people are trying to hurt him. Looks very panicky and anxious. Denies any substance abuse. Says that he's been compliant with his medicine. Begs me repeatedly not to send him back to his group home.  Social history: Apparently lives at a  local group home. Has a guardian. Rest of the details are unclear.  Medical history: Multiple medical problems including high blood pressure coronary artery disease COPD  Substance abuse history: Past history of alcohol abuse. Says that he does not drink anymore. Denies other drug abuse.  Past Psychiatric History: Patient has been diagnosed in the past with schizophrenia as well as cognitive disorder probably dementia. History of depression. He does have a history of suicide attempts by cutting.  Risk to Self: Suicidal Ideation: Yes-Currently Present Suicidal Intent: No Is patient at risk for suicide?: Yes Suicidal Plan?: No-Not Currently/Within Last 6 Months Specify Current Suicidal Plan: Cut wrist Access to Means: Yes Specify Access to Suicidal Means: Knife What has been your use of drugs/alcohol within the last 12 months?: Reports of none How many times?: 4 Other Self Harm Risks: Reports of none Triggers for Past Attempts: None known Intentional Self Injurious Behavior: None Risk to Others: Homicidal Ideation: No Thoughts of Harm to Others: No Comment - Thoughts of Harm to Others: Reports of none Current Homicidal Intent: No Current Homicidal Plan: No Access to Homicidal Means: No Describe Access to Homicidal Means: Reports of none Identified Victim: Reports of none History of harm to others?: No Assessment of Violence: None Noted Violent Behavior Description: Reports of none Does patient have access to weapons?: No Criminal Charges Pending?: No Does patient have a court date: No Prior Inpatient Therapy: Prior Inpatient Therapy: Yes Prior Therapy Dates: 09/2010, 07/2010, 03/2010, 02/2010 Prior Therapy Facilty/Provider(s): [facility] Reason for Treatment: Depression, Bipolar and SI  Prior Outpatient Therapy: Prior Outpatient Therapy: No Prior Therapy Dates: Reports of none Prior Therapy Facilty/Provider(s): Reports of none Reason for Treatment: Reports of none Does patient  have an ACCT team?: No Does patient have Intensive In-House Services?  : No Does patient have [facility] services? : No Does patient have P4CC services?: No  Past Medical History:  Past Medical History:  Diagnosis Date  . Anemia   . Cerebrovascular disease   . Constipation   . COPD (chronic obstructive pulmonary disease) (HCC)   . Coronary artery disease   . Dry eye   . Dyslipidemia   . GERD (gastroesophageal reflux disease)   . Hypertension   . Major depressive disorder   . Major neurocognitive disorder   . Osteoarthritis   . Renal disorder   . Seizure (HCC)   . Thrombocytopenia (HCC)    History reviewed. No pertinent surgical history. Family History: History reviewed. No pertinent family history. Family Psychiatric  History: He does not know of any family history Social History:  History  Alcohol Use No     History  Drug Use No    Social History   Social History  . Marital status: Divorced    Spouse name: N/A  . Number of children: N/A  . Years of education: N/A   Social History Main Topics  . Smoking status: Former Games developer  . Smokeless tobacco: Never Used  . Alcohol use No  . Drug use: No  . Sexual activity: Not Asked   Other Topics Concern  . None   Social History Narrative  . None   Additional Social History:    Allergies:  No Known Allergies  Labs:  No results found for this or any previous visit (from the past 48 hour(s)).  Current Facility-Administered Medications  Medication Dose Route Frequency Provider Last Rate Last Dose  . artificial tears (LACRILUBE) ophthalmic ointment 1 application  1 application Both Eyes QHS Myrna Blazer, MD   1 application at 01/16/17 2144  . atorvastatin (LIPITOR) tablet 10 mg  10 mg Oral q1800 Hendrixx Severin, Jackquline Denmark, MD   10 mg at 01/16/17 1803  . budesonide (PULMICORT) nebulizer solution 0.25 mg  0.25 mg Nebulization BID Myrna Blazer, MD   0.25 mg at 01/17/17 1004  . clopidogrel (PLAVIX) tablet 75  mg  75 mg Oral Daily Manny Vitolo, Jackquline Denmark, MD   75 mg at 01/17/17 1003  . cloZAPine (CLOZARIL) tablet 100 mg  100 mg Oral Daily Jahziel Sinn T, MD   100 mg at 01/17/17 1005  . cloZAPine (CLOZARIL) tablet 200 mg  200 mg Oral QHS Dannielle Baskins T, MD   200 mg at 01/16/17 2140  . divalproex (DEPAKOTE) DR tablet 1,000 mg  1,000 mg Oral QHS Aveah Castell T, MD   1,000 mg at 01/16/17 2141  . docusate sodium (COLACE) capsule 100 mg  100 mg Oral BID Blair Lundeen T, MD   100 mg at 01/17/17 1005  . folic acid (FOLVITE) tablet 1 mg  1 mg Oral Daily Schaevitz, Myra Rude, MD   1 mg at 01/17/17 1007  . levETIRAcetam (KEPPRA) tablet 750 mg  750 mg Oral BID Dashanti Burr T, MD   750 mg at 01/17/17 1006  . Melatonin TABS 5 mg  5 mg Oral QHS Schaevitz, Myra Rude, MD   5 mg at 01/16/17 2143  . pantoprazole (PROTONIX) EC tablet 40 mg  40 mg Oral Daily Amori Colomb, Jackquline Denmark, MD   40 mg at 01/17/17 1007  .  polyethylene glycol (MIRALAX / GLYCOLAX) packet 17 g  17 g Oral Daily Lui Bellis T, MD   17 g at 01/17/17 1009  . polyvinyl alcohol (LIQUIFILM TEARS) 1.4 % ophthalmic solution 2 drop  2 drop Both Eyes QID Myrna Blazer, MD   2 drop at 01/17/17 1000  . prednisoLONE acetate (PRED FORTE) 1 % ophthalmic suspension 1 drop  1 drop Right Eye QID Myrna Blazer, MD   1 drop at 01/17/17 1001  . tamsulosin (FLOMAX) capsule 0.4 mg  0.4 mg Oral Daily Myrna Blazer, MD   0.4 mg at 01/17/17 1001  . venlafaxine XR (EFFEXOR-XR) 24 hr capsule 150 mg  150 mg Oral Q breakfast Dmiya Malphrus, Jackquline Denmark, MD   150 mg at 01/17/17 1001   Current Outpatient Prescriptions  Medication Sig Dispense Refill  . Artificial Tear Ointment (SOOTHE NIGHT TIME) OINT Place 1 application into both eyes at bedtime. Apply a thin ribbon    . atorvastatin (LIPITOR) 10 MG tablet Take 10 mg by mouth at bedtime.    . clopidogrel (PLAVIX) 75 MG tablet Take 75 mg by mouth daily.    . cloZAPine (CLOZARIL) 100 MG tablet Take 100-200 mg by  mouth 2 (two) times daily. 100 mg daily before breakfast and 200 mg daily at bedtime    . divalproex (DEPAKOTE ER) 500 MG 24 hr tablet Take 1,000 mg by mouth at bedtime.    . docusate sodium (COLACE) 100 MG capsule Take 100 mg by mouth 2 (two) times daily.    . fluticasone (FLOVENT HFA) 110 MCG/ACT inhaler Inhale 1 puff into the lungs 2 (two) times daily.    . folic acid (FOLVITE) 1 MG tablet Take 1 mg by mouth daily.    Marland Kitchen levETIRAcetam (KEPPRA) 750 MG tablet Take 750 mg by mouth 2 (two) times daily.    . Melatonin 3 MG TABS Take 3 mg by mouth at bedtime.    . pantoprazole (PROTONIX) 40 MG tablet Take 40 mg by mouth daily.    . polyethylene glycol (MIRALAX / GLYCOLAX) packet Take 17 g by mouth daily.    . polyvinyl alcohol (LIQUIFILM TEARS) 1.4 % ophthalmic solution Place 2 drops into both eyes 4 (four) times daily.    . prednisoLONE acetate (PRED FORTE) 1 % ophthalmic suspension Place 1 drop into the right eye 4 (four) times daily.    . tamsulosin (FLOMAX) 0.4 MG CAPS capsule Take 0.4 mg by mouth daily.    Marland Kitchen venlafaxine XR (EFFEXOR-XR) 150 MG 24 hr capsule Take 150 mg by mouth daily.    Marland Kitchen LORazepam (ATIVAN) 0.5 MG tablet Take 1 tablet (0.5 mg total) by mouth every 8 (eight) hours as needed for anxiety or sleep. (Patient not taking: Reported on 01/12/2017) 5 tablet 0    Musculoskeletal: Strength & Muscle Tone: within normal limits Gait & Station: normal Patient leans: N/A  Psychiatric Specialty Exam: Physical Exam  Nursing note and vitals reviewed. Constitutional: He appears well-developed and well-nourished.  HENT:  Head: Normocephalic and atraumatic.  Eyes: Pupils are equal, round, and reactive to light. Conjunctivae are normal.  Neck: Normal range of motion.  Cardiovascular: Regular rhythm and normal heart sounds.   Respiratory: Effort normal. No respiratory distress.  GI: Soft.  Musculoskeletal: Normal range of motion.  Neurological: He is alert.  Skin: Skin is warm and dry.   Psychiatric: His mood appears anxious. His speech is tangential. He is not agitated and not aggressive. Thought content is paranoid. He expresses  impulsivity. He exhibits a depressed mood. He expresses no suicidal ideation. He exhibits abnormal recent memory and abnormal remote memory.    Review of Systems  Constitutional: Negative.   HENT: Negative.   Eyes: Negative.   Respiratory: Negative.   Cardiovascular: Negative.   Gastrointestinal: Negative.   Musculoskeletal: Positive for joint pain.  Skin: Negative.   Neurological: Negative.   Psychiatric/Behavioral: Positive for memory loss. Negative for depression, hallucinations, substance abuse and suicidal ideas. The patient is nervous/anxious and has insomnia.     Blood pressure 126/70, pulse 76, temperature 97.6 F (36.4 C), temperature source Oral, resp. rate 16, height  (1.753 m), weight 72.6 kg (160 lb), SpO2 94 %.Body mass index is 23.63 kg/m.  General Appearance: Disheveled  Eye Contact:  Fair  Speech:  Garbled  Volume:  Increased  Mood:  Anxious, Depressed and Dysphoric  Affect:  Congruent and Inappropriate  Thought Process:  Disorganized  Orientation:  Negative  Thought Content:  Illogical, Paranoid Ideation, Rumination and Tangential  Suicidal Thoughts:  No  Homicidal Thoughts:  No  Memory:  Immediate;   Fair Recent;   Poor Remote;   Poor  Judgement:  Poor  Insight:  Lacking  Psychomotor Activity:  Restlessness  Concentration:  Concentration: Poor  Recall:  Poor  Fund of Knowledge:  Poor  Language:  Poor  Akathisia:  No  Handed:  Right  AIMS (if indicated):     Assets:  Desire for Improvement Housing Social Support  ADL's:  Impaired  Cognition:  Impaired,  Moderate  Sleep:        Treatment Plan Summary: Daily contact with patient to assess and evaluate symptoms and progress in treatment, Medication management and Plan Reviewed the information over the weekend. Understand that the patient can still  make some manipulative statements about hurting himself however he does not show any tendency to carry through on any of this dangerousness and does not appear to have an illness that requires inpatient treatment. Best policy was reviewed to refer him back to outpatient treatment in the community and have him discharged back to his group home. Continue off of commitment. I understand the concerns that were raised but still think the best thing would be for him to be discharged home to outpatient treatment. Reviewed with social work today.  Disposition: Patient does not meet criteria for psychiatric inpatient admission. Supportive therapy provided about ongoing stressors.  Mordecai Rasmussen, MD 01/17/2017 2:53 PM

## 2017-01-17 NOTE — ED Notes (Signed)
Attempt to awaken for breakfast. Does answer questions but stays on side and will not sit up to eat at this time. Will leave door open to help patient wake up.

## 2017-01-17 NOTE — ED Notes (Signed)
Resting, wants to turn lights out and go to sleep however encouraged to stay awake and watch TV to keep his days and nights straight and he agrees. Denies discomfort at this time.

## 2017-01-17 NOTE — ED Provider Notes (Signed)
-----------------------------------------   4:15 PM on 01/17/2017 -----------------------------------------   Blood pressure 126/70, pulse 76, temperature 97.6 F (36.4 C), temperature source Oral, resp. rate 16, height  (1.753 m), weight 72.6 kg (160 lb), SpO2 94 %.  The patient had no acute events since last update.  Calm and cooperative at this time.  Case discussed with Child psychotherapist. Psych consult reviewed. Patient is medically and psychiatrically cleared to go home. Not considered a danger to himself or others at this time. Although he doesn't like his current group home, his current statements are felt to be manipulative threats and not indicative of risk of real harm. Plan is to send the patient back to his current group home where his support team can arrange a new group home for him. His guardian is aware and agrees. The group home is aware and agrees. The patient has a safe discharge plan and is suitable for outpatient follow-up.   Sharman Cheek, MD 01/17/17 [phone]

## 2017-01-17 NOTE — ED Provider Notes (Signed)
-----------------------------------------   6:37 AM on 01/17/2017 -----------------------------------------   Blood pressure 133/86, pulse 73, temperature 97.6 F (36.4 C), temperature source Oral, resp. rate 14, height  (1.753 m), weight 72.6 kg (160 lb), SpO2 94 %.  The patient had no acute events since last update.  Sleeping at this time.  Disposition is pending Psychiatry/Behavioral Medicine team recommendations.     Irean Hong, MD 01/17/17 [phone]

## 2017-01-17 NOTE — ED Notes (Signed)
PT  VOL  PENDING  D/C ?

## 2017-01-21 ENCOUNTER — Encounter: Payer: Self-pay | Admitting: Emergency Medicine

## 2017-01-21 ENCOUNTER — Emergency Department
Admission: EM | Admit: 2017-01-21 | Discharge: 2017-01-25 | Disposition: A | Discharge status: Home / Self Care | Payer: Medicare Other | Attending: Emergency Medicine | Admitting: Emergency Medicine

## 2017-01-21 DIAGNOSIS — Z87891 Personal history of nicotine dependence: Secondary | ICD-10-CM | POA: Insufficient documentation

## 2017-01-21 DIAGNOSIS — Z046 Encounter for general psychiatric examination, requested by authority: Secondary | ICD-10-CM | POA: Diagnosis present

## 2017-01-21 DIAGNOSIS — I2510 Atherosclerotic heart disease of native coronary artery without angina pectoris: Secondary | ICD-10-CM | POA: Diagnosis not present

## 2017-01-21 DIAGNOSIS — I10 Essential (primary) hypertension: Secondary | ICD-10-CM | POA: Insufficient documentation

## 2017-01-21 DIAGNOSIS — Z7902 Long term (current) use of antithrombotics/antiplatelets: Secondary | ICD-10-CM | POA: Insufficient documentation

## 2017-01-21 DIAGNOSIS — J449 Chronic obstructive pulmonary disease, unspecified: Secondary | ICD-10-CM | POA: Diagnosis not present

## 2017-01-21 DIAGNOSIS — F341 Dysthymic disorder: Secondary | ICD-10-CM | POA: Diagnosis not present

## 2017-01-21 DIAGNOSIS — R45851 Suicidal ideations: Secondary | ICD-10-CM | POA: Insufficient documentation

## 2017-01-21 DIAGNOSIS — Z593 Problems related to living in residential institution: Secondary | ICD-10-CM

## 2017-01-21 DIAGNOSIS — Z79899 Other long term (current) drug therapy: Secondary | ICD-10-CM | POA: Diagnosis not present

## 2017-01-21 DIAGNOSIS — F79 Unspecified intellectual disabilities: Secondary | ICD-10-CM | POA: Diagnosis not present

## 2017-01-21 LAB — CBC
HEMATOCRIT: 41 % (ref 40.0–52.0)
HEMOGLOBIN: 13.2 g/dL (ref 13.0–18.0)
MCH: 23.8 pg — ABNORMAL LOW (ref 26.0–34.0)
MCHC: 32.2 g/dL (ref 32.0–36.0)
MCV: 74 fL — AB (ref 80.0–100.0)
Platelets: 106 10*3/uL — ABNORMAL LOW (ref 150–440)
RBC: 5.54 MIL/uL (ref 4.40–5.90)
RDW: 18.2 % — AB (ref 11.5–14.5)
WBC: 3.4 10*3/uL — AB (ref 3.8–10.6)

## 2017-01-21 LAB — COMPREHENSIVE METABOLIC PANEL
ALT: 9 U/L — ABNORMAL LOW (ref 17–63)
ANION GAP: 12 (ref 5–15)
AST: 28 U/L (ref 15–41)
Albumin: 3.8 g/dL (ref 3.5–5.0)
Alkaline Phosphatase: 52 U/L (ref 38–126)
BILIRUBIN TOTAL: 0.4 mg/dL (ref 0.3–1.2)
BUN: 18 mg/dL (ref 6–20)
CO2: 22 mmol/L (ref 22–32)
Calcium: 8.8 mg/dL — ABNORMAL LOW (ref 8.9–10.3)
Chloride: 104 mmol/L (ref 101–111)
Creatinine, Ser: 1.22 mg/dL (ref 0.61–1.24)
GFR, EST NON AFRICAN AMERICAN: 57 mL/min — AB (ref >60)
GLUCOSE: 113 mg/dL — AB (ref 65–99)
POTASSIUM: 3.7 mmol/L (ref 3.5–5.1)
Sodium: 138 mmol/L (ref 135–145)
Total Protein: 8.1 g/dL (ref 6.5–8.1)

## 2017-01-21 MED ORDER — LORAZEPAM 0.5 MG PO TABS
0.5000 mg | ORAL_TABLET | Freq: Three times a day (TID) | ORAL | Status: DC | PRN
  Administered 2017-01-21 – 2017-01-24 (×5): 0.5 mg via ORAL
  Filled 2017-01-21 (×5): qty 1

## 2017-01-21 MED ORDER — FOLIC ACID 1 MG PO TABS
1.0000 mg | ORAL_TABLET | Freq: Every day | ORAL | Status: DC
  Administered 2017-01-22 – 2017-01-25 (×4): 1 mg via ORAL
  Filled 2017-01-21 (×4): qty 1

## 2017-01-21 MED ORDER — PANTOPRAZOLE SODIUM 40 MG PO TBEC
40.0000 mg | DELAYED_RELEASE_TABLET | Freq: Every day | ORAL | Status: DC
  Administered 2017-01-22 – 2017-01-25 (×4): 40 mg via ORAL
  Filled 2017-01-21 (×4): qty 1

## 2017-01-21 MED ORDER — DOCUSATE SODIUM 100 MG PO CAPS
100.0000 mg | ORAL_CAPSULE | Freq: Two times a day (BID) | ORAL | Status: DC
  Administered 2017-01-21 – 2017-01-25 (×8): 100 mg via ORAL
  Filled 2017-01-21 (×14): qty 1

## 2017-01-21 MED ORDER — CLOPIDOGREL BISULFATE 75 MG PO TABS
75.0000 mg | ORAL_TABLET | Freq: Every day | ORAL | Status: DC
  Administered 2017-01-21 – 2017-01-25 (×5): 75 mg via ORAL
  Filled 2017-01-21 (×8): qty 1

## 2017-01-21 MED ORDER — LEVETIRACETAM 750 MG PO TABS
750.0000 mg | ORAL_TABLET | Freq: Two times a day (BID) | ORAL | Status: DC
  Administered 2017-01-21 – 2017-01-25 (×8): 750 mg via ORAL
  Filled 2017-01-21 (×10): qty 1

## 2017-01-21 MED ORDER — TAMSULOSIN HCL 0.4 MG PO CAPS
0.4000 mg | ORAL_CAPSULE | Freq: Every day | ORAL | Status: DC
  Administered 2017-01-22 – 2017-01-25 (×4): 0.4 mg via ORAL
  Filled 2017-01-21 (×4): qty 1

## 2017-01-21 NOTE — ED Notes (Signed)
Pt oriented to unit.  Calm, cooperative and pleasant with staff. Pt endorses SI. Denies HI and AVH.  Pt states he is unhappy with his current group home. "The residents there are worrisome."   Maintained on 15 minute checks and observation by security camera for safety.

## 2017-01-21 NOTE — ED Triage Notes (Signed)
Pt to ed via police.  Pt was at [employer] with group home staff and ran away from staff.  Pt states he does not want to go back to group home, states he will kill himself if he has to go back.  States , "I will take a knife and cut myself.  I can't take it anymore"

## 2017-01-21 NOTE — ED Notes (Signed)
Pt belongings:  Polo shirt, camo hat, jeans, sneakers, socks, wallet, belt, watch.

## 2017-01-21 NOTE — Progress Notes (Signed)
Pt is alert and oriented. Denies SI at this time but states that if he had to go back to the group home he would probably harm himself. Denies AVH, HI or depression at this time. Snack provided. No physical complaints at this time. Remains on routine obs for safety.

## 2017-01-21 NOTE — ED Notes (Signed)
Dr Fermin Schwab from [facility] called to give update on assessment.

## 2017-01-21 NOTE — ED Provider Notes (Signed)
[facility] Emergency Department Provider Note   ____________________________________________   First MD Initiated Contact with Patient 01/21/17 1513     (approximate)  I have reviewed the triage vital signs and the nursing notes.   HISTORY  Chief Complaint Suicidal    HPI Reginald Blair is a 71 y.o. male here for evaluation as he reports he does not like his group home  Patient tells me he gets bored at his group home, he reports her strange people living there, and tells me that he try to run today and refused to go back to his group home. Denies wanting to kill himself, unless he has to go back to his group home in which case he reports he wants to hurt himself. Denies any thoughts of harming himself otherwise. No desire to hurt anyone else. Denies any overdose or ingestion  No recent medical illness.   Past Medical History:  Diagnosis Date  . Anemia   . Cerebrovascular disease   . Constipation   . COPD (chronic obstructive pulmonary disease) (HCC)   . Coronary artery disease   . Dry eye   . Dyslipidemia   . GERD (gastroesophageal reflux disease)   . Hypertension   . Major depressive disorder   . Major neurocognitive disorder   . Osteoarthritis   . Renal disorder   . Seizure (HCC)   . Thrombocytopenia [facility])     Patient Active Problem List   Diagnosis Date Noted  . Schizophrenia (HCC) 01/11/2017  . Hypertension 01/11/2017  . Osteoarthritis 01/11/2017    History reviewed. No pertinent surgical history.  Prior to Admission medications   Medication Sig Start Date End Date Taking? Authorizing Provider  Artificial Tear Ointment (SOOTHE NIGHT TIME) OINT Place 1 application into both eyes at bedtime. Apply a thin ribbon    [provider]  atorvastatin (LIPITOR) 10 MG tablet Take 10 mg by mouth at bedtime.    [provider]  clopidogrel (PLAVIX) 75 MG tablet Take 75 mg by mouth daily.    [provider]    cloZAPine (CLOZARIL) 100 MG tablet Take 100-200 mg by mouth 2 (two) times daily. 100 mg daily before breakfast and 200 mg daily at bedtime    [provider]  divalproex (DEPAKOTE ER) 500 MG 24 hr tablet Take 1,000 mg by mouth at bedtime.    [provider]  docusate sodium (COLACE) 100 MG capsule Take 100 mg by mouth 2 (two) times daily.    [provider]  fluticasone (FLOVENT HFA) 110 MCG/ACT inhaler Inhale 1 puff into the lungs 2 (two) times daily.    [provider]  folic acid (FOLVITE) 1 MG tablet Take 1 mg by mouth daily.    [provider]  levETIRAcetam (KEPPRA) 750 MG tablet Take 750 mg by mouth 2 (two) times daily.    [provider]  LORazepam (ATIVAN) 0.5 MG tablet Take 1 tablet (0.5 mg total) by mouth every 8 (eight) hours as needed for anxiety or sleep. Patient not taking: Reported on 01/12/2017 01/02/17 01/02/18  Merrily Brittle, MD  Melatonin 3 MG TABS Take 3 mg by mouth at bedtime.    [provider]  pantoprazole (PROTONIX) 40 MG tablet Take 40 mg by mouth daily.    [provider]  polyethylene glycol (MIRALAX / GLYCOLAX) packet Take 17 g by mouth daily.    [provider]  polyvinyl alcohol (LIQUIFILM TEARS) 1.4 % ophthalmic solution Place 2 drops into  both eyes 4 (four) times daily.    [provider]  prednisoLONE acetate (PRED FORTE) 1 % ophthalmic suspension Place 1 drop into the right eye 4 (four) times daily.    [provider]  tamsulosin (FLOMAX) 0.4 MG CAPS capsule Take 0.4 mg by mouth daily.    [provider]  venlafaxine XR (EFFEXOR-XR) 150 MG 24 hr capsule Take 150 mg by mouth daily.    [provider]    Allergies Patient has no known allergies.  History reviewed. No pertinent family history.  Social History Social History  Substance Use Topics  . Smoking status: Former Games developer  . Smokeless tobacco: Never Used  . Alcohol use No    Review  of Systems Constitutional: No fever/chills Eyes: No visual changes. ENT: No sore throat. Cardiovascular: Denies chest pain. Respiratory: Denies shortness of breath. Gastrointestinal: No abdominal pain.  No nausea, no vomiting.  No diarrhea.  No constipation. Genitourinary: Negative for dysuria. Musculoskeletal: Negative for back pain. Skin: Negative for rash. Neurological: Negative for headaches, focal weakness or numbness.    ____________________________________________   PHYSICAL EXAM:  VITAL SIGNS: ED Triage Vitals  Enc Vitals Group     BP 01/21/17 1505 (!) 88/67     Pulse Rate 01/21/17 1505 (!) 108     Resp 01/21/17 1505 16     Temp 01/21/17 1505 98.4 F (36.9 C)     Temp Source 01/21/17 1505 Oral     SpO2 01/21/17 1505 95 %     Weight 01/21/17 1506 160 lb (72.6 kg)     Height --      Head Circumference --      Peak Flow --      Pain Score 01/21/17 2020 0     Pain Loc --      Pain Edu? --      Excl. in GC? --     Constitutional: Alert and oriented. Well appearing and in no acute distress. Eyes: Conjunctivae are normal. Head: Atraumatic. Nose: No congestion/rhinnorhea. Mouth/Throat: Mucous membranes are moist. Neck: No stridor.   Cardiovascular: Normal rate, regular rhythm. Grossly normal heart sounds.  Good peripheral circulation. Respiratory: Normal respiratory effort.  No retractions. Lungs CTAB. Gastrointestinal: Soft and nontender. No distention. Musculoskeletal: No lower extremity tenderness nor edema. Neurologic:  Normal speech and language. No gross focal neurologic deficits are appreciated.  Skin:  Skin is warm, dry and intact. No rash noted. Psychiatric: Mood and affect are normal. denies any suicidal ideation, except if he has to go back to his group home.  ____________________________________________   LABS (all labs ordered are listed, but only abnormal results are displayed)  Labs Reviewed  COMPREHENSIVE METABOLIC PANEL - Abnormal; Notable  for the following:       Result Value   Glucose, Bld 113 (*)    Calcium 8.8 (*)    ALT 9 (*)    GFR calc non Af Amer 57 (*)    All other components within normal limits  CBC - Abnormal; Notable for the following:    WBC 3.4 (*)    MCV 74.0 (*)    MCH 23.8 (*)    RDW 18.2 (*)    Platelets 106 (*)    All other components within normal limits  URINE DRUG SCREEN, QUALITATIVE ([facility] ONLY)  ACETAMINOPHEN LEVEL  ETHANOL  SALICYLATE LEVEL  VALPROIC ACID LEVEL   ____________________________________________  EKG   ____________________________________________  RADIOLOGY   ____________________________________________   PROCEDURES  Procedure(s) performed: None  Procedures  Critical Care performed: No  ____________________________________________   INITIAL IMPRESSION / ASSESSMENT AND PLAN / ED COURSE  Pertinent labs & imaging results that were available during my care of the patient were reviewed by me and considered in my medical decision making (see chart for details).   no noted evidence of acute medical illness. Initially slightly hypotensive and I tachycardic, but it just been brought in by police and vital signs have stabilized and he is awake and alert in no distress. Denies any medical illness or overdose. I have placed a consult to psychiatry as the patient is under IVC. He seems to only reports suicidal ideation if he has to go back to his current group home. Denies that he would otherwise hurt himself.  ----------------------------------------- 8:25 PM on 01/21/2017 -----------------------------------------  Patient taken off IVC by psychiatry. They do however recommend social work, and given the patient has a guardian the patient will stay in the [facility] with a consult to social work. Have added on a Depakote level for the morning as recommended by psychiatry. Ongoing care assigned to Dr. Dolores Frame.       ____________________________________________   FINAL  CLINICAL IMPRESSION(S) / ED DIAGNOSES  Final diagnoses:  Suicidal ideation  Lives in group home      NEW MEDICATIONS STARTED DURING THIS VISIT:  New Prescriptions   No medications on file     Note:  This document was prepared using Dragon voice recognition software and may include unintentional dictation errors.     Sharyn Creamer, MD 01/22/17 [phone]

## 2017-01-21 NOTE — ED Notes (Signed)
GUARDIANCorwin Levins, [phone], opt [phone]

## 2017-01-21 NOTE — ED Notes (Signed)
IVC 

## 2017-01-21 NOTE — ED Notes (Signed)
SOC in progress.  

## 2017-01-21 NOTE — ED Notes (Signed)
Called [facility] for consult [phone]

## 2017-01-21 NOTE — ED Notes (Signed)
VOL  PENDING  TTS  CONSULT 
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
## 2017-01-21 NOTE — Progress Notes (Signed)
SOC in progress.  

## 2017-01-22 DIAGNOSIS — F341 Dysthymic disorder: Secondary | ICD-10-CM | POA: Diagnosis not present

## 2017-01-22 LAB — VALPROIC ACID LEVEL: VALPROIC ACID LVL: 20 ug/mL — AB (ref 50.0–100.0)

## 2017-01-22 NOTE — ED Notes (Signed)
Patient is alert, ate 100% of lunch, no signs of distress, just states I hope I can go to the hospital and get some help" patient is pleasant, q 15 minute checks and camera surveillance in progress for safety.

## 2017-01-22 NOTE — Progress Notes (Signed)
  SUBJECTIVE: Reginald Blair is a 71 y.o. male referred for placement at new group home: Patient evaluated by [facility] Sanjuana Letters (see note on paper chart) with recommendation that patient does not meet inpatient psych criteria. Note Indicated check Depakote levels and adjust medication dose accordingly.     Patient continues to express risk of harm to self : Suicidal ideation.  States he will stab himself with a knife (and demonstrates gesture to LCSW) if he has to go back. Reports he will not go back to the  Group home.  Patient indicated he would like to go live with his family in [place].  However when RN talks with patient he provides different information.    LCSW called patient's guardian Rosine Abe [phone] he has been in a training for two days and did not know patient was back in the hospital.  Report he is ok with patient not returning to current group home. Reports this is the 3rd placement for patient.  Reports patient's medication needs to be evaluated, moods are not stable. Ron is working on a new placement for patient.  He will reach out to the facility to see where patient is on the list.     INTERVENTION:  Supportive Counseling, Reflective listening    ISSUES DISCUSSED:  support system, what he dislikes about the group home, what he liked about the other group home.  PLAN:  1. CSW will check with ED MD when results come back with Depakote levels.  2. CSW to call guardian Ron for an update on the placement.  Sammuel Hines, LCSW Licensed Clinical Social Worker 3:07 PM

## 2017-01-22 NOTE — ED Notes (Signed)
Patient is sleeping, no signs of distress, q 15 minute checks and camera surveillance in progress for safety.  

## 2017-01-22 NOTE — ED Notes (Signed)
Social worker talking with Patient in dayroom.

## 2017-01-22 NOTE — ED Notes (Signed)
Voluntary. Pending TTS consult

## 2017-01-22 NOTE — ED Notes (Signed)
Patient is awake, He is alert and oriented, he is forgetful at times, patient states that he is having SI thoughts at this time and would like some medication to stop the thoughts, nurse let him know that psychiatrist would see him and would order medication as needed. Patient states " I just can't go back to that group home" Patient is calm and cooperative, he contracts for safety while He is here, q 15 minute checks and camera surveillance in progress for protection.

## 2017-01-22 NOTE — ED Notes (Signed)
Patient had complained of feeling anxiety, and He said," I just want to leave this place, I will be ok, I have friends I could stay with, nurse ask him if He had family and He said " In [place], but they have there on life, and I don't want to bother them" Nurse gave Patient ativan for anxiety, will continue to monitor.

## 2017-01-22 NOTE — ED Notes (Signed)
Patient taking a shower, no signs of distress, q 15 minute checks and camera surveillance in progress for safety.

## 2017-01-22 NOTE — ED Provider Notes (Signed)
-----------------------------------------   6:34 AM on 01/22/2017 -----------------------------------------   Blood pressure 120/81, pulse 85, temperature (!) 97.4 F (36.3 C), temperature source Oral, resp. rate 16, weight 72.6 kg (160 lb), SpO2 100 %.  The patient had no acute events since last update.  Sleeping at this time.  Disposition is pending CSW team recommendations. Pending Depakote level this morning.     Irean Hong, MD 01/22/17 [phone]

## 2017-01-23 DIAGNOSIS — F341 Dysthymic disorder: Secondary | ICD-10-CM | POA: Diagnosis not present

## 2017-01-23 NOTE — ED Notes (Signed)
Pt. Alert and oriented, warm and dry, in no distress. Pt. Denies SI, HI, and AVH. Pt. Encouraged to let nursing staff know of any concerns or needs. 

## 2017-01-23 NOTE — ED Notes (Signed)

## 2017-01-23 NOTE — ED Notes (Signed)
Pt has been resting most of the day. Pt was offered food and drinks throughout the day. Pt has been calm and cooperative. Remains safe. Will cont to monitor pt.

## 2017-01-23 NOTE — ED Notes (Signed)
Depakote level 

## 2017-01-23 NOTE — ED Notes (Signed)
Pt came to nursing station and stated, "he was seeing and hearing voiced". Pt asked for his prn ativan. Pt given ativan 0.5mg  per request for his anxiety. Pt stated his anxiety level was a 10/10 will cont to monitor pt.

## 2017-01-23 NOTE — ED Notes (Signed)

## 2017-01-23 NOTE — ED Provider Notes (Signed)
-----------------------------------------   7:32 AM on 01/23/2017 -----------------------------------------   Blood pressure (!) 142/81, pulse 79, temperature 97.8 F (36.6 C), temperature source Oral, resp. rate 16, weight 72.6 kg (160 lb), SpO2 98 %.  The patient had no acute events since last update.  Calm and cooperative at this time.  Disposition is pending CSW team recommendations.     Irean Hong, MD 01/23/17 [phone]

## 2017-01-23 NOTE — ED Notes (Signed)
Patient is voluntary and is pending placement. 

## 2017-01-24 DIAGNOSIS — F79 Unspecified intellectual disabilities: Secondary | ICD-10-CM | POA: Diagnosis not present

## 2017-01-24 DIAGNOSIS — F341 Dysthymic disorder: Secondary | ICD-10-CM | POA: Diagnosis not present

## 2017-01-24 LAB — CBC WITH DIFFERENTIAL/PLATELET
BASOS ABS: 0 10*3/uL (ref 0–0.1)
Basophils Relative: 1 %
EOS ABS: 0.1 10*3/uL (ref 0–0.7)
Eosinophils Relative: 3 %
HCT: 38.5 % — ABNORMAL LOW (ref 40.0–52.0)
Hemoglobin: 12.3 g/dL — ABNORMAL LOW (ref 13.0–18.0)
LYMPHS ABS: 1 10*3/uL (ref 1.0–3.6)
Lymphocytes Relative: 21 %
MCH: 23.5 pg — ABNORMAL LOW (ref 26.0–34.0)
MCHC: 31.9 g/dL — ABNORMAL LOW (ref 32.0–36.0)
MCV: 73.7 fL — ABNORMAL LOW (ref 80.0–100.0)
MONOS PCT: 14 %
Monocytes Absolute: 0.7 10*3/uL (ref 0.2–1.0)
NEUTROS ABS: 2.8 10*3/uL (ref 1.4–6.5)
NEUTROS PCT: 61 %
PLATELETS: 83 10*3/uL — AB (ref 150–440)
RBC: 5.22 MIL/uL (ref 4.40–5.90)
RDW: 18 % — AB (ref 11.5–14.5)
WBC: 4.6 10*3/uL (ref 3.8–10.6)

## 2017-01-24 MED ORDER — CLOZAPINE 100 MG PO TABS
ORAL_TABLET | ORAL | Status: AC
  Administered 2017-01-24: 200 mg via ORAL
  Filled 2017-01-24: qty 2

## 2017-01-24 MED ORDER — CLOZAPINE 100 MG PO TABS
200.0000 mg | ORAL_TABLET | Freq: Every day | ORAL | Status: DC
  Administered 2017-01-24: 200 mg via ORAL

## 2017-01-24 MED ORDER — DIVALPROEX SODIUM ER 500 MG PO TB24
1000.0000 mg | ORAL_TABLET | Freq: Every day | ORAL | Status: DC
  Administered 2017-01-24: 1000 mg via ORAL
  Filled 2017-01-24: qty 2

## 2017-01-24 MED ORDER — CLOZAPINE 100 MG PO TABS
100.0000 mg | ORAL_TABLET | Freq: Every day | ORAL | Status: DC
  Administered 2017-01-25: 100 mg via ORAL
  Filled 2017-01-24: qty 1

## 2017-01-24 MED ORDER — VENLAFAXINE HCL ER 75 MG PO CP24
150.0000 mg | ORAL_CAPSULE | Freq: Every day | ORAL | Status: DC
  Administered 2017-01-25: 150 mg via ORAL
  Filled 2017-01-24: qty 2

## 2017-01-24 NOTE — ED Provider Notes (Signed)
-----------------------------------------   6:52 AM on 01/24/2017 -----------------------------------------   Blood pressure 138/80, pulse 90, temperature 98 F (36.7 C), temperature source Oral, resp. rate 16, weight 72.6 kg (160 lb), SpO2 100 %.  The patient had no acute events since last update.  Calm and cooperative at this time.  Disposition is pending Psychiatry/Behavioral Medicine team recommendations.     Jeanmarie Plant, MD 01/24/17 [phone]

## 2017-01-24 NOTE — ED Notes (Signed)
Pt requesting PRN medication to help him relax. Medication administered as ordered. Pt calm and cooperative. Maintained on 15 minute checks and observation by security camera for safety.

## 2017-01-24 NOTE — Discharge Instructions (Signed)
please return to the group home. Follow-up with your regular doctor and take your medicines as directed. Return for any further problems.

## 2017-01-24 NOTE — ED Notes (Signed)

## 2017-01-24 NOTE — ED Notes (Signed)
Pt received dinner tray.

## 2017-01-24 NOTE — Progress Notes (Signed)
Clozapine monitoring ANC 15.8. Lab entered and patient eligible per [facility] website.  Fulton Reek, PharmD, BCPS  01/24/17 11:11 PM     10/15 ANC 2.8. Patient is eligible with every 4 week monitoring per [facility] website.  Fulton Reek, PharmD, BCPS  01/24/17 11:12 PM

## 2017-01-24 NOTE — Consult Note (Signed)
[facility] Face-to-Face Psychiatry Consult   Reason for Consult:  Consult for 71 year old man with a history of chronic mental illness who is claiming suicidal ideation Referring Physician:  Mayford Knife Patient Identification: Reginald Blair MRN:  [id number] Principal Diagnosis: <principal problem not specified> Diagnosis:   Patient Active Problem List   Diagnosis Date Noted  . Schizophrenia (HCC) [F20.9] 01/11/2017  . Hypertension [I10] 01/11/2017  . Osteoarthritis [M19.90] 01/11/2017    Total Time spent with patient: 20 minutes  Subjective:   Reginald Blair is a 71 y.o. male patient admitted with "don't send me back to that place".  This is a follow-up evaluation for Monday, October 43.71 year old man with dementia anddevelopmental disabilit He has been in the emergency room for almost 2 weeks awaiting placement. He came up to me today and asked me if he could please go home back to his group home. I reminded him that he had previously refused to go backbut today he he would be fine with that.Marland Kitchen  HPI:  Patient seen chart reviewed. 71 year old man with a history of chronic mental illness came to the hospital apparently initially for complaints of falls or arthritis or joint pain. Started talking about how people at his group home were dangerous and he wanted to kill himself. Patient tells me a very incoherent story. He says that he had a something or other in the legs that he can't remember the name of and the people at the group home did something or other to him. Literally doesn't make any sense. Then he announces that he will kill himself if he has to go back there because the people are trying to hurt him. Looks very panicky and anxious. Denies any substance abuse. Says that he's been compliant with his medicine. Begs me repeatedly not to send him back to his group home.  Social history: Apparently lives at a local group home. Has a guardian. Rest of the details are unclear.  Medical  history: Multiple medical problems including high blood pressure coronary artery disease COPD  Substance abuse history: Past history of alcohol abuse. Says that he does not drink anymore. Denies other drug abuse.  Past Psychiatric History: Patient has been diagnosed in the past with schizophrenia as well as cognitive disorder probably dementia. History of depression. He does have a history of suicide attempts by cutting.  Risk to Self: Suicidal Ideation: No Suicidal Intent: No Is patient at risk for suicide?: No Suicidal Plan?: No Specify Current Suicidal Plan: Pt plans to cut himself Access to Means: No Specify Access to Suicidal Means: Reports of none What has been your use of drugs/alcohol within the last 12 months?: Reports of none How many times?: 4 Other Self Harm Risks: Reports of none Triggers for Past Attempts: None known Intentional Self Injurious Behavior: None Comment - Self Injurious Behavior: n/a Risk to Others: Homicidal Ideation: No Thoughts of Harm to Others: No Comment - Thoughts of Harm to Others: Reports of none Current Homicidal Intent: No Current Homicidal Plan: No Access to Homicidal Means: No Describe Access to Homicidal Means: Reports of none Identified Victim: Reports of none History of harm to others?: No Assessment of Violence: None Noted Violent Behavior Description: Reports of none Does patient have access to weapons?: No Criminal Charges Pending?: No Does patient have a court date: No Prior Inpatient Therapy: Prior Inpatient Therapy: Yes Prior Therapy Dates: 09/2010, 07/2010, 03/2010, 02/2010 Prior Therapy Facilty/Provider(s): [facility] Reason for Treatment: Depression, Bipolar and SI Prior Outpatient Therapy: Prior Outpatient  Therapy: No Prior Therapy Dates: Reports of none Prior Therapy Facilty/Provider(s): Reports of none Reason for Treatment: Reports of none Does patient have an ACCT team?: No Does patient have Intensive In-House Services?   : No Does patient have [facility] services? : No Does patient have P4CC services?: No  Past Medical History:  Past Medical History:  Diagnosis Date  . Anemia   . Cerebrovascular disease   . Constipation   . COPD (chronic obstructive pulmonary disease) (HCC)   . Coronary artery disease   . Dry eye   . Dyslipidemia   . GERD (gastroesophageal reflux disease)   . Hypertension   . Major depressive disorder   . Major neurocognitive disorder   . Osteoarthritis   . Renal disorder   . Seizure (HCC)   . Thrombocytopenia (HCC)    History reviewed. No pertinent surgical history. Family History: History reviewed. No pertinent family history. Family Psychiatric  History: He does not know of any family history Social History:  History  Alcohol Use No     History  Drug Use No    Social History   Social History  . Marital status: Divorced    Spouse name: N/A  . Number of children: N/A  . Years of education: N/A   Social History Main Topics  . Smoking status: Former Games developer  . Smokeless tobacco: Never Used  . Alcohol use No  . Drug use: No  . Sexual activity: Not Asked   Other Topics Concern  . None   Social History Narrative  . None   Additional Social History:    Allergies:  No Known Allergies  Labs:  Results for orders placed or performed during the hospital encounter of 01/21/17 (from the past 48 hour(s))  Valproic acid level     Status: Abnormal   Collection Time: 01/22/17  3:03 PM  Result Value Ref Range   Valproic Acid Lvl 20 (L) 50.0 - 100.0 ug/mL    Current Facility-Administered Medications  Medication Dose Route Frequency Provider Last Rate Last Dose  . clopidogrel (PLAVIX) tablet 75 mg  75 mg Oral Daily Sharyn Creamer, MD   75 mg at 01/24/17 0935  . docusate sodium (COLACE) capsule 100 mg  100 mg Oral BID Sharyn Creamer, MD   100 mg at 01/24/17 0935  . folic acid (FOLVITE) tablet 1 mg  1 mg Oral Daily Sharyn Creamer, MD   1 mg at 01/24/17 0935  . levETIRAcetam  (KEPPRA) tablet 750 mg  750 mg Oral BID Sharyn Creamer, MD   750 mg at 01/24/17 0935  . LORazepam (ATIVAN) tablet 0.5 mg  0.5 mg Oral Q8H PRN Sharyn Creamer, MD   0.5 mg at 01/24/17 0817  . pantoprazole (PROTONIX) EC tablet 40 mg  40 mg Oral Daily Sharyn Creamer, MD   40 mg at 01/24/17 0935  . tamsulosin (FLOMAX) capsule 0.4 mg  0.4 mg Oral Daily Sharyn Creamer, MD   0.4 mg at 01/24/17 1610   Current Outpatient Prescriptions  Medication Sig Dispense Refill  . Artificial Tear Ointment (SOOTHE NIGHT TIME) OINT Place 1 application into both eyes at bedtime. Apply a thin ribbon    . atorvastatin (LIPITOR) 10 MG tablet Take 10 mg by mouth at bedtime.    . clopidogrel (PLAVIX) 75 MG tablet Take 75 mg by mouth daily.    . cloZAPine (CLOZARIL) 100 MG tablet Take 100-200 mg by mouth 2 (two) times daily. 100 mg daily before breakfast and 200 mg daily at bedtime    .  divalproex (DEPAKOTE ER) 500 MG 24 hr tablet Take 1,000 mg by mouth at bedtime.    . docusate sodium (COLACE) 100 MG capsule Take 100 mg by mouth 2 (two) times daily.    . fluticasone (FLOVENT HFA) 110 MCG/ACT inhaler Inhale 1 puff into the lungs 2 (two) times daily.    . folic acid (FOLVITE) 1 MG tablet Take 1 mg by mouth daily.    Marland Kitchen levETIRAcetam (KEPPRA) 750 MG tablet Take 750 mg by mouth 2 (two) times daily.    Marland Kitchen LORazepam (ATIVAN) 0.5 MG tablet Take 1 tablet (0.5 mg total) by mouth every 8 (eight) hours as needed for anxiety or sleep. (Patient not taking: Reported on 01/12/2017) 5 tablet 0  . Melatonin 3 MG TABS Take 3 mg by mouth at bedtime.    . pantoprazole (PROTONIX) 40 MG tablet Take 40 mg by mouth daily.    . polyethylene glycol (MIRALAX / GLYCOLAX) packet Take 17 g by mouth daily.    . polyvinyl alcohol (LIQUIFILM TEARS) 1.4 % ophthalmic solution Place 2 drops into both eyes 4 (four) times daily.    . prednisoLONE acetate (PRED FORTE) 1 % ophthalmic suspension Place 1 drop into the right eye 4 (four) times daily.    . tamsulosin (FLOMAX) 0.4  MG CAPS capsule Take 0.4 mg by mouth daily.    Marland Kitchen venlafaxine XR (EFFEXOR-XR) 150 MG 24 hr capsule Take 150 mg by mouth daily.      Musculoskeletal: Strength & Muscle Tone: within normal limits Gait & Station: normal Patient leans: N/A  Psychiatric Specialty Exam: Physical Exam  Nursing note and vitals reviewed. Constitutional: He appears well-developed and well-nourished.  HENT:  Head: Normocephalic and atraumatic.  Eyes: Pupils are equal, round, and reactive to light. Conjunctivae are normal.  Neck: Normal range of motion.  Cardiovascular: Regular rhythm and normal heart sounds.   Respiratory: Effort normal. No respiratory distress.  GI: Soft.  Musculoskeletal: Normal range of motion.  Neurological: He is alert.  Skin: Skin is warm and dry.  Psychiatric: His mood appears not anxious. His speech is tangential. He is not agitated and not aggressive. Thought content is not paranoid. He does not express impulsivity. He does not exhibit a depressed mood. He expresses no suicidal ideation. He exhibits abnormal recent memory and abnormal remote memory.    Review of Systems  Constitutional: Negative.   HENT: Negative.   Eyes: Negative.   Respiratory: Negative.   Cardiovascular: Negative.   Gastrointestinal: Negative.   Musculoskeletal: Positive for joint pain.  Skin: Negative.   Neurological: Negative.   Psychiatric/Behavioral: Positive for memory loss. Negative for depression, hallucinations, substance abuse and suicidal ideas. The patient is not nervous/anxious and does not have insomnia.     Blood pressure 131/80, pulse 85, temperature 98 F (36.7 C), temperature source Oral, resp. rate 18, weight 72.6 kg (160 lb), SpO2 99 %.Body mass index is 23.63 kg/m.  General Appearance: Casual  Eye Contact:  Fair  Speech:  Clear and Coherent  Volume:  Normal  Mood:  Euthymic  Affect:  Congruent  Thought Process:  Goal Directed  Orientation:  Negative  Thought Content:  Logical   Suicidal Thoughts:  No  Homicidal Thoughts:  No  Memory:  Immediate;   Fair Recent;   Poor Remote;   Poor  Judgement:  Poor  Insight:  Lacking  Psychomotor Activity:  Restlessness  Concentration:  Concentration: Poor  Recall:  Poor  Fund of Knowledge:  Poor  Language:  Poor  Akathisia:  No  Handed:  Right  AIMS (if indicated):     Assets:  Desire for Improvement Housing Social Support  ADL's:  Impaired  Cognition:  Impaired,  Moderate  Sleep:        Treatment Plan Summary: Daily contact with patient to assess and evaluate symptoms and progress in treatment, Medication management and Plan patient has been no behavior problem. He is calm and now stating full agreement to go back to his group home. He is not on commitment and does not require inpatient treatment. Plan is for discharge. Situation reviewed with social work and emergency room physician. He can follow-up in the community as previously.  Disposition: Patient does not meet criteria for psychiatric inpatient admission. Supportive therapy provided about ongoing stressors.  Mordecai Rasmussen, MD 01/24/2017 1:36 PM

## 2017-01-24 NOTE — ED Notes (Signed)
Pt is restless, moving from room to dayroom every couple of minutes. Pt to be discharged back to group home. LCSW to work out details. Maintained on 15 minute checks and observation by security camera for safety.

## 2017-01-24 NOTE — Clinical Social Work Note (Addendum)
CSW received consult for "patient doesnt like current group home, eval for other group home opportunities." Pt was just in ED on 10/2 and discharged 10/8 back to same group home. CSW staffed case with CSW AD Zack. Plan is to send pt back to [facility] once medically/psychiatrically cleared. CSW left voicemail for Silas Flood at [phone].   1:27pm - CSW received a call from Dr. Toni Amend stating pt asking to return back to group home and has been psychiatrically cleared. CSW has been continually trying to reach Silas Flood at [phone] and [phone] since AM and has left 3 voicemail. CSW spoke with Algernon Huxley, owner of [facility] [phone]) who stated she has been trying to get in touch with Mr. Renard Matter also. Ms. Erie Noe and CSW to continue trying to reach Mr. Renard Matter.   CSW spoke with Mr. Renard Matter who stated he has secured another group home placement for pt, but he cannot go there today. Mr. Renard Matter to call Ms. Erie Noe to see if she will accept him back for until new facilitiy is able to accept him.   CSW left voicemails for Mr. Renard Matter and  Ms. Erie Noe. CSW was able to speak with Ms. Peckingham who stated she spoke with Mr. Renard Matter who stated Mr. Renard Matter to pick pt up. RN to call Mr. Renard Matter at [phone], if he has not shown up by 7pm. CSW signing off as no further Social Work needs identified.   Corlis Hove, Theresia Majors, [facility] Clinical Social Worker-ED [phone]

## 2017-01-24 NOTE — ED Notes (Signed)
Pt given breakfast tray

## 2017-01-24 NOTE — ED Notes (Signed)
Pt. Alert and oriented, warm and dry, in no distress. Pt. Denies SI, HI, and AVH. Pt. Encouraged to let nursing staff know of any concerns or needs. 

## 2017-01-24 NOTE — ED Notes (Signed)
Pt received lunch tray 

## 2017-01-25 DIAGNOSIS — F341 Dysthymic disorder: Secondary | ICD-10-CM | POA: Diagnosis not present

## 2017-01-25 NOTE — ED Notes (Signed)
Pt being d/c to group home. Pt verbalized understanding of d/c. All belongings given to pt upon d/c. Pt dressed in his personal clothing upon d/c. Pt denies pain/ah/vh/si/and hi prior to d/c. No other needs/concerns voiced/reported prior to d/c.

## 2017-01-25 NOTE — Clinical Social Work Note (Addendum)
CSW spoke with [facility] Silas Flood [phone]) and confirmed he will be picking up pt today, no later than 1pm, to transport pt to his new [facility] placement. CSW made [facility] aware. CSW signing off as no further Social Work needs identified.   Corlis Hove, Theresia Majors, [facility] Clinical Social Worker-ED  [phone]

## 2017-01-25 NOTE — ED Provider Notes (Signed)
-----------------------------------------   7:32 AM on 01/25/2017 -----------------------------------------   BP (!) 159/86 (BP Location: Right Arm)   Pulse 85   Temp 98.3 F (36.8 C) (Oral)   Resp 16   Wt 72.6 kg (160 lb)   SpO2 98%   BMI 23.63 kg/m   No acute events since last update.   Disposition is pending per Psychiatry/Behavioral Medicine team recommendations.     Phineas Semen, MD 01/25/17 [phone]

## 2017-08-20 ENCOUNTER — Emergency Department (HOSPITAL_COMMUNITY)
Admission: EM | Admit: 2017-08-20 | Discharge: 2017-08-20 | Disposition: A | Discharge status: Home / Self Care | Payer: Medicare Other | Attending: Emergency Medicine | Admitting: Emergency Medicine

## 2017-08-20 ENCOUNTER — Encounter (HOSPITAL_COMMUNITY): Payer: Self-pay | Admitting: Cardiology

## 2017-08-20 ENCOUNTER — Emergency Department (HOSPITAL_COMMUNITY): Payer: Medicare Other

## 2017-08-20 DIAGNOSIS — Z87891 Personal history of nicotine dependence: Secondary | ICD-10-CM | POA: Insufficient documentation

## 2017-08-20 DIAGNOSIS — I2510 Atherosclerotic heart disease of native coronary artery without angina pectoris: Secondary | ICD-10-CM | POA: Insufficient documentation

## 2017-08-20 DIAGNOSIS — Z7902 Long term (current) use of antithrombotics/antiplatelets: Secondary | ICD-10-CM | POA: Diagnosis not present

## 2017-08-20 DIAGNOSIS — R42 Dizziness and giddiness: Secondary | ICD-10-CM

## 2017-08-20 DIAGNOSIS — R079 Chest pain, unspecified: Secondary | ICD-10-CM | POA: Diagnosis present

## 2017-08-20 DIAGNOSIS — J449 Chronic obstructive pulmonary disease, unspecified: Secondary | ICD-10-CM | POA: Diagnosis not present

## 2017-08-20 DIAGNOSIS — I10 Essential (primary) hypertension: Secondary | ICD-10-CM | POA: Diagnosis not present

## 2017-08-20 DIAGNOSIS — Z79899 Other long term (current) drug therapy: Secondary | ICD-10-CM | POA: Diagnosis not present

## 2017-08-20 LAB — HEPATIC FUNCTION PANEL
ALBUMIN: 4 g/dL (ref 3.5–5.0)
ALT: 14 U/L — ABNORMAL LOW (ref 17–63)
AST: 24 U/L (ref 15–41)
Alkaline Phosphatase: 55 U/L (ref 38–126)
BILIRUBIN TOTAL: 0.5 mg/dL (ref 0.3–1.2)
Bilirubin, Direct: 0.1 mg/dL (ref 0.1–0.5)
Indirect Bilirubin: 0.4 mg/dL (ref 0.3–0.9)
Total Protein: 7.5 g/dL (ref 6.5–8.1)

## 2017-08-20 LAB — BASIC METABOLIC PANEL
Anion gap: 9 (ref 5–15)
BUN: 12 mg/dL (ref 6–20)
CO2: 28 mmol/L (ref 22–32)
CREATININE: 0.91 mg/dL (ref 0.61–1.24)
Calcium: 8.8 mg/dL — ABNORMAL LOW (ref 8.9–10.3)
Chloride: 104 mmol/L (ref 101–111)
GFR calc non Af Amer: >60 mL/min (ref >60)
Glucose, Bld: 72 mg/dL (ref 65–99)
Potassium: 3.8 mmol/L (ref 3.5–5.1)
Sodium: 141 mmol/L (ref 135–145)

## 2017-08-20 LAB — CBC
HCT: 41.8 % (ref 39.0–52.0)
Hemoglobin: 13.6 g/dL (ref 13.0–17.0)
MCH: 24.5 pg — ABNORMAL LOW (ref 26.0–34.0)
MCHC: 32.5 g/dL (ref 30.0–36.0)
MCV: 75.2 fL — AB (ref 78.0–100.0)
PLATELETS: 102 10*3/uL — AB (ref 150–400)
RBC: 5.56 MIL/uL (ref 4.22–5.81)
RDW: 19.4 % — AB (ref 11.5–15.5)
WBC: 4.1 10*3/uL (ref 4.0–10.5)

## 2017-08-20 LAB — SALICYLATE LEVEL

## 2017-08-20 LAB — ETHANOL

## 2017-08-20 LAB — ACETAMINOPHEN LEVEL

## 2017-08-20 LAB — TROPONIN I

## 2017-08-20 MED ORDER — HYDROXYZINE HCL 25 MG PO TABS: 25.0000 mg | ORAL_TABLET | Freq: Two times a day (BID) | ORAL | 0 refills | Status: DC | PRN

## 2017-08-20 NOTE — Discharge Instructions (Signed)
The hospital that you were recently admitted to wanted you to have Vistaril 25 mg every 12 hours as needed for anxiety.  Please take all of your other medications exactly as prescribed on this paperwork.  Your CAT scan of the brain, your chest x-ray and blood work have all been reassuring.  There is no sign of heart attack.  If you should develop increased difficulty walking, changes in vision, difficulty with weakness or numbness or difficulty with speech he should return to the emergency department immediately.  I want you to be followed up within 48 hours with your family doctor for recheck.  [facility] Primary Care Doctor List    Kari Baars MD. Specialty: Pulmonary Disease Contact information: [address]  [address]  [place] [address]  [phone]   Syliva Overman, MD. Specialty: [facility] Medicine Contact information: [address]  [place] [address]  [phone]   Lilyan Punt, MD. Specialty: [facility] Medicine Contact information: [address]  [place] [address]  [phone]   Avon Gully, MD Specialty: Internal Medicine Contact information: [address] [place] [address]  [phone]   Catalina Pizza, MD. Specialty: Internal Medicine Contact information: [address]  [place] [address]  [phone]    [facility] (Dr. Selena Batten) Specialty: Family Medicine Contact information: [address]  [place] [address]  [phone]   John Giovanni, MD. Specialty: [facility] Medicine Contact information: [address]  [address]  [place] [address]  [phone]   Carylon Perches, MD. Specialty: Internal Medicine Contact information: [address]  [address]  [place] [address]  [phone]    [facility]  [address] [place], [place] [address] [phone]  Services [facility] offers a variety of basic  health services.  Services include but are not limited to: Blood pressure checks  Heart rate checks  Blood sugar checks  Urine analysis  Rapid strep tests  Pregnancy tests.  Health education and referrals  People needing more complex services will be directed to a physician online. Using these virtual visits, doctors can evaluate and prescribe medicine and treatments. There will be no medication on-site, though [facility] will help patients fill their prescriptions at little to no cost.   For More information please go to: [email or web address]   Please obtain all of your results from medical records or have your doctors office obtain the results - share them with your doctor - you should be seen at your doctors office in the next 2 days. Call today to arrange your follow up. Take the medications as prescribed. Please review all of the medicines and only take them if you do not have an allergy to them. Please be aware that if you are taking birth control pills, taking other prescriptions, ESPECIALLY ANTIBIOTICS may make the birth control ineffective - if this is the case, either do not engage in sexual activity or use alternative methods of birth control such as condoms until you have finished the medicine and your family doctor says it is OK to restart them. If you are on a blood thinner such as COUMADIN, be aware that any other medicine that you take may cause the coumadin to either work too much, or not enough - you should have your coumadin level rechecked in next 7 days if this is the case.  ?  It is also a possibility that you have an allergic reaction to any of the medicines that you have been prescribed - Everybody reacts differently to medications and while MOST people have no trouble with most medicines, you may have a reaction such as nausea, vomiting, rash, swelling, shortness of breath. If this is the case, please stop taking the  medicine immediately and contact your physician.  ?  You should return to the ER if you develop severe or worsening symptoms.
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
## 2017-08-20 NOTE — ED Triage Notes (Signed)
Chest pain off and on since yesterday.  Denies any pain right now.   C/o feeling dizzy when he gets up.  CBG 88.

## 2017-08-21 ENCOUNTER — Other Ambulatory Visit: Payer: Self-pay

## 2017-08-21 ENCOUNTER — Emergency Department
Admission: EM | Admit: 2017-08-21 | Discharge: 2017-08-22 | Disposition: A | Discharge status: Home / Self Care | Payer: Medicare Other | Attending: Emergency Medicine | Admitting: Emergency Medicine

## 2017-08-21 ENCOUNTER — Encounter: Payer: Self-pay | Admitting: Emergency Medicine

## 2017-08-21 DIAGNOSIS — I10 Essential (primary) hypertension: Secondary | ICD-10-CM | POA: Diagnosis not present

## 2017-08-21 DIAGNOSIS — G40909 Epilepsy, unspecified, not intractable, without status epilepticus: Secondary | ICD-10-CM

## 2017-08-21 DIAGNOSIS — F329 Major depressive disorder, single episode, unspecified: Secondary | ICD-10-CM | POA: Diagnosis present

## 2017-08-21 DIAGNOSIS — F250 Schizoaffective disorder, bipolar type: Secondary | ICD-10-CM | POA: Diagnosis not present

## 2017-08-21 DIAGNOSIS — I2510 Atherosclerotic heart disease of native coronary artery without angina pectoris: Secondary | ICD-10-CM | POA: Diagnosis not present

## 2017-08-21 DIAGNOSIS — Z87891 Personal history of nicotine dependence: Secondary | ICD-10-CM | POA: Insufficient documentation

## 2017-08-21 DIAGNOSIS — Z7902 Long term (current) use of antithrombotics/antiplatelets: Secondary | ICD-10-CM | POA: Insufficient documentation

## 2017-08-21 DIAGNOSIS — R45851 Suicidal ideations: Secondary | ICD-10-CM | POA: Diagnosis not present

## 2017-08-21 DIAGNOSIS — Z79899 Other long term (current) drug therapy: Secondary | ICD-10-CM | POA: Diagnosis not present

## 2017-08-21 DIAGNOSIS — J449 Chronic obstructive pulmonary disease, unspecified: Secondary | ICD-10-CM | POA: Diagnosis not present

## 2017-08-21 LAB — COMPREHENSIVE METABOLIC PANEL
ALBUMIN: 4.7 g/dL (ref 3.5–5.0)
ALT: 14 U/L — ABNORMAL LOW (ref 17–63)
ANION GAP: 11 (ref 5–15)
AST: 35 U/L (ref 15–41)
Alkaline Phosphatase: 61 U/L (ref 38–126)
BUN: 15 mg/dL (ref 6–20)
CHLORIDE: 101 mmol/L (ref 101–111)
CO2: 25 mmol/L (ref 22–32)
CREATININE: 1.01 mg/dL (ref 0.61–1.24)
Calcium: 9.2 mg/dL (ref 8.9–10.3)
GFR calc non Af Amer: >60 mL/min (ref >60)
Glucose, Bld: 81 mg/dL (ref 65–99)
Potassium: 3.9 mmol/L (ref 3.5–5.1)
SODIUM: 137 mmol/L (ref 135–145)
TOTAL PROTEIN: 8.8 g/dL — AB (ref 6.5–8.1)
Total Bilirubin: 0.6 mg/dL (ref 0.3–1.2)

## 2017-08-21 LAB — ETHANOL

## 2017-08-21 LAB — CBC
HCT: 46.2 % (ref 40.0–52.0)
Hemoglobin: 15 g/dL (ref 13.0–18.0)
MCH: 24.6 pg — AB (ref 26.0–34.0)
MCHC: 32.4 g/dL (ref 32.0–36.0)
MCV: 75.8 fL — AB (ref 80.0–100.0)
Platelets: 125 10*3/uL — ABNORMAL LOW (ref 150–440)
RBC: 6.1 MIL/uL — AB (ref 4.40–5.90)
RDW: 21.1 % — AB (ref 11.5–14.5)
WBC: 6.5 10*3/uL (ref 3.8–10.6)

## 2017-08-21 LAB — URINE DRUG SCREEN, QUALITATIVE (ARMC ONLY)
Amphetamines, Ur Screen: NOT DETECTED
Barbiturates, Ur Screen: NOT DETECTED
Benzodiazepine, Ur Scrn: NOT DETECTED
CANNABINOID 50 NG, UR ~~LOC~~: NOT DETECTED
Cocaine Metabolite,Ur ~~LOC~~: NOT DETECTED
MDMA (ECSTASY) UR SCREEN: NOT DETECTED
Methadone Scn, Ur: NOT DETECTED
Opiate, Ur Screen: NOT DETECTED
PHENCYCLIDINE (PCP) UR S: NOT DETECTED
Tricyclic, Ur Screen: NOT DETECTED

## 2017-08-21 LAB — ACETAMINOPHEN LEVEL: Acetaminophen (Tylenol), Serum: <10 ug/mL — ABNORMAL LOW (ref 10–30)

## 2017-08-21 LAB — SALICYLATE LEVEL: Salicylate Lvl: <7 mg/dL (ref 2.8–30.0)

## 2017-08-21 LAB — LIPASE, BLOOD: LIPASE: 41 U/L (ref 11–51)

## 2017-08-21 MED ORDER — LORAZEPAM 1 MG PO TABS
1.0000 mg | ORAL_TABLET | Freq: Four times a day (QID) | ORAL | Status: DC | PRN
  Administered 2017-08-21 – 2017-08-22 (×4): 1 mg via ORAL
  Filled 2017-08-21 (×4): qty 1

## 2017-08-21 NOTE — ED Triage Notes (Addendum)
Pt to ED via EMS, was found walking down the road after leaving assit living facility. Unknown name of facility . Pt states he is SI with a plan to cut himself. PT hx of cutting. Pt states is he not on right medication, PT A&Ox4, denies ETOH or drug use.

## 2017-08-21 NOTE — ED Notes (Signed)
Pt requesting medication to help "calm my nerves."  PO PRN Ativan administered as ordered. Pt is cooperative with staff. Maintained on 15 minute checks and observation by security camera for safety.

## 2017-08-21 NOTE — ED Notes (Signed)

## 2017-08-21 NOTE — ED Notes (Signed)
Patient resting quietly in room. No noted distress or abnormal behaviors noted. Will continue 15 minute checks and observation by security camera for safety. 

## 2017-08-21 NOTE — ED Notes (Signed)
Sitter at bedside.

## 2017-08-21 NOTE — ED Notes (Signed)
Pt dressed out at this time, shoes, jeans, shirt, jacket, wallet had $15 counted by this RN and Dorian NT with pt in room. Pt also had wallet, hat, lighter, chap stick, and cigarettes

## 2017-08-21 NOTE — ED Notes (Signed)
Patient alert and oriented. Patient states he left his group home because he did not want to be there anymore and he was having self harm thoughts with a plan. Patient contracts for safety while on unit. Patient did not want to engage in conversation about what led up to him feeling like he wants to harm himself. Patient provided support and encouragement. Q 15 minute checks in progress and patient remains safe on unit. Patient given food and fluids. Monitoring continues.

## 2017-08-21 NOTE — ED Notes (Signed)
Patient received PM snack. 

## 2017-08-21 NOTE — ED Provider Notes (Addendum)
[facility] Emergency Department Provider Note  Time seen: 10:16 AM  I have reviewed the triage vital signs and the nursing notes.   HISTORY  Chief Complaint Suicidal    HPI Smaran Ganesh Deeg is a 72 y.o. male with a past medical history of COPD, gastric reflux, hypertension, depression, reported bipolar and schizophrenia who presents to the emergency department for suicidal ideation.  According to the patient he has been having thoughts of finding something sharp to cut his wrist to kill himself.  He states in the 80s he attempted to kill himself by cutting his artery in his wrist.  Patient denies any substance use.  Patient does not believe he is getting all of his medications at his group home.  Patient states if we send him back to the group home he will find something sharp and kill himself.  Patient has no medical complaints.     Past Medical History:  Diagnosis Date  . Anemia   . Cerebrovascular disease   . Constipation   . COPD (chronic obstructive pulmonary disease) (HCC)   . Coronary artery disease   . Dry eye   . Dyslipidemia   . GERD (gastroesophageal reflux disease)   . Hypertension   . Major depressive disorder   . Major neurocognitive disorder   . Osteoarthritis   . Renal disorder   . Seizure (HCC)   . Thrombocytopenia [facility])     Patient Active Problem List   Diagnosis Date Noted  . Schizophrenia (HCC) 01/11/2017  . Hypertension 01/11/2017  . Osteoarthritis 01/11/2017    History reviewed. No pertinent surgical history.  Prior to Admission medications   Medication Sig Start Date End Date Taking? Authorizing Provider  atorvastatin (LIPITOR) 10 MG tablet Take 20 mg by mouth at bedtime.     [provider]  clopidogrel (PLAVIX) 75 MG tablet Take 75 mg by mouth daily.    [provider]  Docusate Sodium (DSS) 100 MG CAPS Take 1 capsule by mouth 2 (two) times daily. 08/16/17 09/15/17  [provider]  ferrous  sulfate 325 (65 FE) MG tablet Take 1 tablet by mouth daily. 08/16/17   [provider]  folic acid (FOLVITE) 1 MG tablet Take 1 mg by mouth daily.    [provider]  hydrOXYzine (ATARAX/VISTARIL) 25 MG tablet Take 1 tablet (25 mg total) by mouth 2 (two) times daily as needed for anxiety. 08/20/17 09/19/17  Eber Hong, MD  levETIRAcetam (KEPPRA) 750 MG tablet Take 1 tablet by mouth 2 (two) times daily. 08/16/17   [provider]  mometasone (ASMANEX 120 METERED DOSES) 220 MCG/INH inhaler Inhale 2 puffs into the lungs daily.    [provider]  nicotine (NICODERM CQ - DOSED IN MG/24 HOURS) 21 mg/24hr patch Place 21 mg onto the skin daily.    [provider]  OLANZapine (ZYPREXA) 10 MG tablet Take 1 tablet by mouth at bedtime. 08/16/17   [provider]  pantoprazole (PROTONIX) 40 MG tablet Take 40 mg by mouth daily.    [provider]  tamsulosin (FLOMAX) 0.4 MG CAPS capsule Take 0.4 mg by mouth daily.    [provider]  Valproate Sodium (DEPAKENE) 250 MG/5ML SOLN solution Take 20 mLs by mouth at bedtime. 08/16/17   [provider]  venlafaxine XR (EFFEXOR-XR) 150 MG 24 hr capsule Take 150 mg by mouth daily.    [provider]    No Known Allergies  No family history on file.  Social History Social History   Tobacco Use  . Smoking status: Former Games developer  . Smokeless tobacco: Never Used  Substance Use Topics  . Alcohol use: No  . Drug use: No    Review of Systems Constitutional: Negative for fever. Eyes: Negative for visual complaints ENT: Negative for recent illness/congestion Cardiovascular: Negative for chest pain.  States he had chest pain the other day and went to [facility] for evaluation which was normal. Respiratory: Negative for shortness of breath. Gastrointestinal: Negative for abdominal pain Genitourinary: Negative for urinary compaints Musculoskeletal: Negative for  musculoskeletal complaints Skin: Negative for skin complaints  Neurological: Negative for headache All other ROS negative  ____________________________________________   PHYSICAL EXAM:  VITAL SIGNS: ED Triage Vitals  Enc Vitals Group     BP 08/21/17 0941 (!) 170/99     Pulse Rate 08/21/17 0941 (!) 104     Resp 08/21/17 0941 16     Temp 08/21/17 1002 98.1 F (36.7 C)     Temp Source 08/21/17 1002 Oral     SpO2 08/21/17 0941 99 %     Weight 08/21/17 0942 125 lb (56.7 kg)     Height 08/21/17 0942  (1.753 m)     Head Circumference --      Peak Flow --      Pain Score 08/21/17 0941 0     Pain Loc --      Pain Edu? --      Excl. in GC? --    Constitutional: Alert and oriented. Well appearing and in no distress. Eyes: Normal exam ENT   Head: Normocephalic and atraumatic.   Mouth/Throat: Mucous membranes are moist. Cardiovascular: Normal rate, regular rhythm. No murmur Respiratory: Normal respiratory effort without tachypnea nor retractions. Breath sounds are clear Gastrointestinal: Soft and nontender. No distention.   Musculoskeletal: Nontender with normal range of motion in all extremities.  Neurologic:  Normal speech and language. No gross focal neurologic deficits Skin:  Skin is warm, dry and intact.  Psychiatric: Continued suicidal ideations. ____________________________________________   INITIAL IMPRESSION / ASSESSMENT AND PLAN / ED COURSE  Pertinent labs & imaging results that were available during my care of the patient were reviewed by me and considered in my medical decision making (see chart for details).  Patient presents to the emergency department with suicidal ideation.  Having thoughts of finding something sharp to cut his wrists and kill himself.  Reports he attempted to do so in the 80s as well.  Denies any substance use.  States if we send him back to the group home he will kill himself.  Given his suicidal ideation I will place him under an  involuntary commitment order.  We will check labs and continue to closely monitor.  We will have telemetry psychiatry as well as TTS see the patient.  Patient's work-up is been largely nonrevealing.  Psychiatric consultation pending.  Patient care signed out to oncoming physician.  Telemetry psychiatry is seen the patient.  Recommend inpatient admission.  Will maintain the IVC and the patient will be admitted once a bed becomes available either in-house or at a different facility.  ____________________________________________   FINAL CLINICAL IMPRESSION(S) / ED DIAGNOSES  Suicidal ideation    Minna Antis, MD 08/21/17 1350    Minna Antis, MD 08/21/17 1446
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
## 2017-08-21 NOTE — ED Notes (Signed)
ED Provider at bedside. 

## 2017-08-21 NOTE — ED Notes (Signed)
Pt moved to [facility]. Report given to [facility], [place].

## 2017-08-22 ENCOUNTER — Encounter: Payer: Self-pay | Admitting: Psychiatry

## 2017-08-22 DIAGNOSIS — F250 Schizoaffective disorder, bipolar type: Secondary | ICD-10-CM

## 2017-08-22 DIAGNOSIS — G40909 Epilepsy, unspecified, not intractable, without status epilepticus: Secondary | ICD-10-CM

## 2017-08-22 DIAGNOSIS — F329 Major depressive disorder, single episode, unspecified: Secondary | ICD-10-CM | POA: Diagnosis not present

## 2017-08-22 LAB — VALPROIC ACID LEVEL: Valproic Acid Lvl: <10 ug/mL — ABNORMAL LOW (ref 50.0–100.0)

## 2017-08-22 MED ORDER — BUDESONIDE 0.25 MG/2ML IN SUSP
0.2500 mg | Freq: Two times a day (BID) | RESPIRATORY_TRACT | Status: DC
  Filled 2017-08-22 (×4): qty 2

## 2017-08-22 MED ORDER — ATORVASTATIN CALCIUM 20 MG PO TABS: 20.0000 mg | ORAL_TABLET | Freq: Every day | ORAL | Status: DC

## 2017-08-22 MED ORDER — DOCUSATE SODIUM 100 MG PO CAPS
100.0000 mg | ORAL_CAPSULE | Freq: Two times a day (BID) | ORAL | Status: DC
  Administered 2017-08-22: 100 mg via ORAL
  Filled 2017-08-22 (×3): qty 1

## 2017-08-22 MED ORDER — MOMETASONE FUROATE 220 MCG/INH IN AEPB: 1.0000 | INHALATION_SPRAY | Freq: Every day | RESPIRATORY_TRACT | Status: DC

## 2017-08-22 MED ORDER — CLOPIDOGREL BISULFATE 75 MG PO TABS
75.0000 mg | ORAL_TABLET | Freq: Every day | ORAL | Status: DC
  Administered 2017-08-22: 75 mg via ORAL
  Filled 2017-08-22 (×3): qty 1

## 2017-08-22 MED ORDER — HYDROXYZINE HCL 25 MG PO TABS
25.0000 mg | ORAL_TABLET | Freq: Two times a day (BID) | ORAL | Status: DC | PRN
  Administered 2017-08-22: 25 mg via ORAL

## 2017-08-22 MED ORDER — NICOTINE 21 MG/24HR TD PT24
21.0000 mg | MEDICATED_PATCH | Freq: Every day | TRANSDERMAL | Status: DC
  Administered 2017-08-22: 21 mg via TRANSDERMAL
  Filled 2017-08-22: qty 1

## 2017-08-22 MED ORDER — TAMSULOSIN HCL 0.4 MG PO CAPS
0.4000 mg | ORAL_CAPSULE | Freq: Every day | ORAL | Status: DC
  Administered 2017-08-22: 0.4 mg via ORAL
  Filled 2017-08-22: qty 1

## 2017-08-22 MED ORDER — OLANZAPINE 10 MG PO TABS: 10.0000 mg | ORAL_TABLET | Freq: Every day | ORAL | Status: DC

## 2017-08-22 MED ORDER — VENLAFAXINE HCL ER 75 MG PO CP24
150.0000 mg | ORAL_CAPSULE | Freq: Every day | ORAL | Status: DC
  Administered 2017-08-22: 150 mg via ORAL
  Filled 2017-08-22: qty 2

## 2017-08-22 MED ORDER — LEVETIRACETAM 750 MG PO TABS
750.0000 mg | ORAL_TABLET | Freq: Two times a day (BID) | ORAL | Status: DC
  Administered 2017-08-22: 750 mg via ORAL
  Filled 2017-08-22 (×2): qty 1

## 2017-08-22 MED ORDER — FOLIC ACID 1 MG PO TABS
1.0000 mg | ORAL_TABLET | Freq: Every day | ORAL | Status: DC
  Administered 2017-08-22: 1 mg via ORAL
  Filled 2017-08-22 (×2): qty 1

## 2017-08-22 MED ORDER — FERROUS SULFATE 325 (65 FE) MG PO TABS
325.0000 mg | ORAL_TABLET | Freq: Every day | ORAL | Status: DC
  Filled 2017-08-22: qty 1

## 2017-08-22 MED ORDER — ACETAMINOPHEN 325 MG PO TABS
650.0000 mg | ORAL_TABLET | Freq: Once | ORAL | Status: AC
  Administered 2017-08-22: 650 mg via ORAL

## 2017-08-22 MED ORDER — HYDROXYZINE HCL 25 MG PO TABS
ORAL_TABLET | ORAL | Status: AC
  Filled 2017-08-22: qty 1

## 2017-08-22 MED ORDER — VALPROATE SODIUM 250 MG/5ML PO SOLN
1000.0000 mg | Freq: Every day | ORAL | Status: DC
  Filled 2017-08-22: qty 20

## 2017-08-22 NOTE — ED Notes (Signed)
PT  PENDING  D/C  IVC  RESCINDED

## 2017-08-22 NOTE — ED Notes (Signed)
Report to include situation, background, assessment and recommendations from Wendy RN. Patient sleeping, respirations regular and unlabored. Q15 minute rounds and security camera observation to continue.    

## 2017-08-22 NOTE — ED Notes (Signed)
IVC PAPERS  RESCINDED  PER  DR  Hinton Dyer  MD  INFORMED  RN  [facility]

## 2017-08-22 NOTE — ED Notes (Signed)
Patient is calm, but complained of headache, nurse did receive order for tylenol. Patient talked with Dr. Jennet Maduro and she is going to discharge Patient back to group home.

## 2017-08-22 NOTE — ED Notes (Signed)
Patient is excited to be going back to group home, no signs of distress, denies Si/hi or avh.

## 2017-08-22 NOTE — Discharge Instructions (Addendum)
You have been seen in the Emergency Department (ED)  today for a psychiatric complaint.  You have been evaluated by psychiatry and we believe you are safe to be discharged from the hospital.   ° °Please return to the Emergency Department (ED)  immediately if you have ANY thoughts of hurting yourself or anyone else, so that we may help you. ° °Please avoid alcohol and drug use. ° °Follow up with your doctor and/or therapist as soon as possible regarding today's ED  visit.  ° °You may call crisis hotline for [place] at [phone]. ° °

## 2017-08-22 NOTE — ED Provider Notes (Signed)
-----------------------------------------   1:25 PM on 08/22/2017 -----------------------------------------   Blood pressure 125/76, pulse 84, temperature 97.6 F (36.4 C), temperature source Oral, resp. rate 18, height  (1.753 m), weight 56.7 kg (125 lb), SpO2 96 %.  Patient has been evaluated by psychiatry and cleared for discharge. IVC lifted by Dr. Jennet Maduro. Patient's labs have been reviewed with no acute findings. Patient will be discharged at this time to group home    Don Perking, [place], MD 08/22/17 1326

## 2017-08-22 NOTE — ED Notes (Signed)
Patient ate 100% of lunch and beverage. Patient is oriented, no signs of distress.

## 2017-08-22 NOTE — ED Notes (Signed)
Patient ate 100% of lunch and had beverage.  

## 2017-08-22 NOTE — Consult Note (Signed)
[facility] Consult   Reason for Consult:  Suicidal ideation Referring Physician:  Dr. Alfred Levins Patient Identification: Reginald Blair MRN:  [id number] Principal Diagnosis: Schizoaffective disorder, bipolar type [facility]) Diagnosis:   Patient Active Problem List   Diagnosis Date Noted  . Schizoaffective disorder, bipolar type ([place]) [F25.0] 01/11/2017    Priority: High  . Seizure disorder ([place]) [Y19.509] 08/22/2017  . Hypertension [I10] 01/11/2017  . Osteoarthritis [M19.90] 01/11/2017    Total Time spent with patient: 1 hour  Subjective:    Identifying data. Reginald Blair is a 72 year old male with a history of schizoaffective disorder.  Chief complaint. "I had chest pain."  History of present illness. Information was obtained from the patient and the chart. The patient was just hospitalized at [facility] for 21 days for exacerbation of psychosis and discharged on 5/7 to a new group home. He wandered away from the facility, was picked up by the police and brought to the [facility] on 5/11 where he complained of chest pain. There was nothing wrong with his heart. He wondered away again and was brought to out ER where he complained of suicidal ideation and not being on the right medicines. He was restarted on his medications of Depakote, VPA level <10, Zyprexa Effexor and Keppra. The patient has been away from his group home for several days, hence low VPA level.  At the time of my evaluation the patient denies any symptoms of depression, anxiety or psychosis. He is not suicidal or homicidal. Sleep and appetite are good.  He accepts and tolerates medications well. He is very glad that his heart is OK. He no longer experiences chest pains. He is really glad that he is allowed to return to his facility. He reports no problems there. "It is nice."  Family psychiatric history. His uncle committed suicide.   Past psychiatric history. Diagnosed with schizophrenia and bipolar.  Hospitalized twice before. One suicide attempt by severely cutting his wrist. Multiple medication trials. Likes his current regimen.  Social history. Disabled from mental illness. Has been in group homes before. He is allowed to return to [facility].   Risk to Self: Suicidal Ideation: Yes-Currently Present Suicidal Intent: Yes-Currently Present Is patient at risk for suicide?: Yes Suicidal Plan?: Yes-Currently Present Specify Current Suicidal Plan: cut himself in in the throat with a piece of glass  Access to Means: Yes Specify Access to Suicidal Means: glass bottle, knife What has been your use of drugs/alcohol within the last 12 months?: None reported  How many times?: 0 Other Self Harm Risks: cutting your arms  Triggers for Past Attempts: Unpredictable Intentional Self Injurious Behavior: Cutting Comment - Self Injurious Behavior: cutting arm Risk to Others: Homicidal Ideation: No Thoughts of Harm to Others: No Current Homicidal Intent: No Current Homicidal Plan: No Access to Homicidal Means: No Describe Access to Homicidal Means: None reported  Identified Victim: None reported  History of harm to others?: No Assessment of Violence: None Noted Violent Behavior Description: None reported  Does patient have access to weapons?: No Criminal Charges Pending?: No Does patient have a court date: No Prior Inpatient Therapy: Prior Inpatient Therapy: Yes Prior Therapy Dates: 8 days ago Prior Therapy Facilty/Provider(s): [facility], [place] Reason for Treatment: Depresion SI Prior Outpatient Therapy: Prior Outpatient Therapy: No Does patient have an ACCT team?: No Does patient have Intensive In-House Services?  : No Does patient have [facility] services? : No Does patient have P4CC services?: No  Past Medical History:  Past Medical  History:  Diagnosis Date  . Anemia   . Cerebrovascular disease   . Constipation   . COPD (chronic obstructive pulmonary disease) ([place])   . Coronary  artery disease   . Dry eye   . Dyslipidemia   . GERD (gastroesophageal reflux disease)   . Hypertension   . Major depressive disorder   . Major neurocognitive disorder   . Osteoarthritis   . Renal disorder   . Seizure ([place])   . Thrombocytopenia ([place])    History reviewed. No pertinent surgical history. Family History: History reviewed. No pertinent family history.  Social History:  Social History   Substance and Sexual Activity  Alcohol Use No     Social History   Substance and Sexual Activity  Drug Use No    Social History   Socioeconomic History  . Marital status: Divorced    Spouse name: Not on file  . Number of children: Not on file  . Years of education: Not on file  . Highest education level: Not on file  Occupational History  . Not on file  Social Needs  . Financial resource strain: Not on file  . Food insecurity:    Worry: Not on file    Inability: Not on file  . Transportation needs:    Medical: Not on file    Non-medical: Not on file  Tobacco Use  . Smoking status: Former Research scientist (life sciences)  . Smokeless tobacco: Never Used  Substance and Sexual Activity  . Alcohol use: No  . Drug use: No  . Sexual activity: Not on file  Lifestyle  . Physical activity:    Days per week: Not on file    Minutes per session: Not on file  . Stress: Not on file  Relationships  . Social connections:    Talks on phone: Not on file    Gets together: Not on file    Attends religious service: Not on file    Active member of club or organization: Not on file    Attends meetings of clubs or organizations: Not on file    Relationship status: Not on file  Other Topics Concern  . Not on file  Social History Narrative  . Not on file   Additional Social History:    Allergies:  No Known Allergies  Labs:  Results for orders placed or performed during the hospital encounter of 08/21/17 (from the past 48 hour(s))  Lipase, blood     Status: None   Collection Time: 08/21/17  9:54 AM   Result Value Ref Range   Lipase 41 11 - 51 U/L    Comment: Performed at [facility], [address] [place], [place] [address]  Comprehensive metabolic panel     Status: Abnormal   Collection Time: 08/21/17  9:54 AM  Result Value Ref Range   Sodium 137 135 - 145 mmol/L   Potassium 3.9 3.5 - 5.1 mmol/L   Chloride 101 101 - 111 mmol/L   CO2 25 22 - 32 mmol/L   Glucose, Bld 81 65 - 99 mg/dL   BUN 15 6 - 20 mg/dL   Creatinine, Ser 1.01 0.61 - 1.24 mg/dL   Calcium 9.2 8.9 - 10.3 mg/dL   Total Protein 8.8 (H) 6.5 - 8.1 g/dL   Albumin 4.7 3.5 - 5.0 g/dL   AST 35 15 - 41 U/L   ALT 14 (L) 17 - 63 U/L   Alkaline Phosphatase 61 38 - 126 U/L   Total Bilirubin 0.6  0.3 - 1.2 mg/dL   GFR calc non Af Amer >60 >60 mL/min   GFR calc Af Amer >60 >60 mL/min    Comment: (NOTE) The eGFR has been calculated using the CKD EPI equation. This calculation has not been validated in all clinical situations. eGFR's persistently <60 mL/min signify possible Chronic Kidney Disease.    Anion gap 11 5 - 15    Comment: Performed at [facility], [address] [place], [place] [address]  Ethanol     Status: None   Collection Time: 08/21/17  9:54 AM  Result Value Ref Range   Alcohol, Ethyl (B) <10 <10 mg/dL    Comment:        LOWEST DETECTABLE LIMIT FOR SERUM ALCOHOL IS 10 mg/dL FOR MEDICAL PURPOSES ONLY Performed at [facility], [address] [place], [place] [address]   Salicylate level     Status: None   Collection Time: 08/21/17  9:54 AM  Result Value Ref Range   Salicylate Lvl <0.2 2.8 - 30.0 mg/dL    Comment: Performed at [facility], [address] [place], [place] [address]  Acetaminophen level     Status: Abnormal   Collection Time: 08/21/17  9:54 AM  Result Value Ref Range   Acetaminophen (Tylenol), Serum <10 (L) 10 - 30 ug/mL    Comment:        THERAPEUTIC CONCENTRATIONS VARY SIGNIFICANTLY. A RANGE OF 10-30 ug/mL MAY BE AN  EFFECTIVE CONCENTRATION FOR MANY PATIENTS. HOWEVER, SOME ARE BEST TREATED AT CONCENTRATIONS OUTSIDE THIS RANGE. ACETAMINOPHEN CONCENTRATIONS >150 ug/mL AT 4 HOURS AFTER INGESTION AND >50 ug/mL AT 12 HOURS AFTER INGESTION ARE OFTEN ASSOCIATED WITH TOXIC REACTIONS. Performed at [facility], [address] [place], [place] [address]   cbc     Status: Abnormal   Collection Time: 08/21/17  9:54 AM  Result Value Ref Range   WBC 6.5 3.8 - 10.6 K/uL   RBC 6.10 (H) 4.40 - 5.90 MIL/uL   Hemoglobin 15.0 13.0 - 18.0 g/dL   HCT 46.2 40.0 - 52.0 %   MCV 75.8 (L) 80.0 - 100.0 fL   MCH 24.6 (L) 26.0 - 34.0 pg   MCHC 32.4 32.0 - 36.0 g/dL   RDW 21.1 (H) 11.5 - 14.5 %   Platelets 125 (L) 150 - 440 K/uL    Comment: Performed at [facility], [address], [place], [place] [address]  Urine Drug Screen, Qualitative     Status: None   Collection Time: 08/21/17  9:54 AM  Result Value Ref Range   Tricyclic, Ur Screen NONE DETECTED NONE DETECTED   Amphetamines, Ur Screen NONE DETECTED NONE DETECTED   MDMA (Ecstasy)Ur Screen NONE DETECTED NONE DETECTED   Cocaine Metabolite,Ur [place] NONE DETECTED NONE DETECTED   Opiate, Ur Screen NONE DETECTED NONE DETECTED   Phencyclidine (PCP) Ur S NONE DETECTED NONE DETECTED   Cannabinoid 50 Ng, Ur  NONE DETECTED NONE DETECTED   Barbiturates, Ur Screen NONE DETECTED NONE DETECTED   Benzodiazepine, Ur Scrn NONE DETECTED NONE DETECTED   Methadone Scn, Ur NONE DETECTED NONE DETECTED    Comment: (NOTE) Tricyclics + metabolites, urine    Cutoff 1000 ng/mL Amphetamines + metabolites, urine  Cutoff 1000 ng/mL MDMA (Ecstasy), urine              Cutoff 500 ng/mL Cocaine Metabolite, urine          Cutoff 300 ng/mL Opiate + metabolites, urine  Cutoff 300 ng/mL Phencyclidine (PCP), urine         Cutoff 25 ng/mL Cannabinoid, urine                 Cutoff 50 ng/mL Barbiturates + metabolites, urine  Cutoff 200 ng/mL Benzodiazepine, urine               Cutoff 200 ng/mL Methadone, urine                   Cutoff 300 ng/mL The urine drug screen provides only a preliminary, unconfirmed analytical test result and should not be used for non-medical purposes. Clinical consideration and professional judgment should be applied to any positive drug screen result due to possible interfering substances. A more specific alternate chemical method must be used in order to obtain a confirmed analytical result. Gas chromatography / mass spectrometry (GC/MS) is the preferred confirmat ory method. Performed at [facility], [address] [place], [place] [address]   Valproic acid level     Status: Abnormal   Collection Time: 08/21/17  9:55 AM  Result Value Ref Range   Valproic Acid Lvl <10 (L) 50.0 - 100.0 ug/mL    Comment: Performed at [facility], [address] [place], [place] [address]    Current Facility-Administered Medications  Medication Dose Route Frequency Provider Last Rate Last Dose  . atorvastatin (LIPITOR) tablet 20 mg  20 mg Oral QHS Paulette Blanch, MD      . budesonide (PULMICORT) nebulizer solution 0.25 mg  0.25 mg Nebulization BID Clapacs, John T, MD      . clopidogrel (PLAVIX) tablet 75 mg  75 mg Oral Daily Paulette Blanch, MD   75 mg at 08/22/17 0911  . docusate sodium (COLACE) capsule 100 mg  100 mg Oral BID Paulette Blanch, MD   100 mg at 08/22/17 0911  . ferrous sulfate tablet 325 mg  325 mg Oral Daily Paulette Blanch, MD      . folic acid (FOLVITE) tablet 1 mg  1 mg Oral Daily Paulette Blanch, MD   1 mg at 08/22/17 0901  . hydrOXYzine (ATARAX/VISTARIL) 25 MG tablet           . hydrOXYzine (ATARAX/VISTARIL) tablet 25 mg  25 mg Oral BID PRN Paulette Blanch, MD   25 mg at 08/22/17 0641  . levETIRAcetam (KEPPRA) tablet 750 mg  750 mg Oral BID Paulette Blanch, MD   750 mg at 08/22/17 1227  . LORazepam (ATIVAN) tablet 1 mg  1 mg Oral Q6H PRN Harvest Dark, MD   1 mg at 08/22/17 0900  . nicotine (NICODERM CQ - dosed in  mg/24 hours) patch 21 mg  21 mg Transdermal Daily Paulette Blanch, MD   21 mg at 08/22/17 0901  . OLANZapine (ZYPREXA) tablet 10 mg  10 mg Oral QHS Paulette Blanch, MD      . tamsulosin [facility]) capsule 0.4 mg  0.4 mg Oral Daily Paulette Blanch, MD   0.4 mg at 08/22/17 0900  . Valproate Sodium (DEPAKENE) solution 1,000 mg  1,000 mg Oral QHS Paulette Blanch, MD      . venlafaxine XR (EFFEXOR-XR) 24 hr capsule 150 mg  150 mg Oral Daily Paulette Blanch, MD   150 mg at 08/22/17 0900   Current Outpatient Medications  Medication Sig Dispense Refill  . atorvastatin (LIPITOR) 20 MG tablet Take 20 mg by mouth at bedtime.     Marland Kitchen  clopidogrel (PLAVIX) 75 MG tablet Take 75 mg by mouth daily.    Mariane Baumgarten Sodium (DSS) 100 MG CAPS Take 1 capsule by mouth 2 (two) times daily.    . ferrous sulfate 325 (65 FE) MG tablet Take 1 tablet by mouth daily.    . folic acid (FOLVITE) 1 MG tablet Take 1 mg by mouth daily.    Marland Kitchen levETIRAcetam (KEPPRA) 750 MG tablet Take 1 tablet by mouth 2 (two) times daily.    . mometasone (ASMANEX 120 METERED DOSES) 220 MCG/INH inhaler Inhale 1 puff into the lungs daily.     . nicotine (NICODERM CQ - DOSED IN MG/24 HOURS) 21 mg/24hr patch Place 21 mg onto the skin daily.    Marland Kitchen OLANZapine (ZYPREXA) 10 MG tablet Take 1 tablet by mouth at bedtime.    . tamsulosin (FLOMAX) 0.4 MG CAPS capsule Take 0.4 mg by mouth daily.    . Valproate Sodium (DEPAKENE) 250 MG/5ML SOLN solution Take 20 mLs by mouth at bedtime.    Marland Kitchen venlafaxine XR (EFFEXOR-XR) 150 MG 24 hr capsule Take 150 mg by mouth daily.    . hydrOXYzine (ATARAX/VISTARIL) 25 MG tablet Take 1 tablet (25 mg total) by mouth 2 (two) times daily as needed for anxiety. (Patient not taking: Reported on 08/21/2017) 12 tablet 0    Musculoskeletal: Strength & Muscle Tone: within normal limits Gait & Station: normal Patient leans: N/A  Psychiatric Specialty Exam: Physical Exam  Nursing note and vitals reviewed. Psychiatric: He has a normal mood and affect.  His speech is normal and behavior is normal. Thought content normal. Cognition and memory are normal. He expresses impulsivity.    Review of Systems  Neurological: Negative.   Psychiatric/Behavioral: Negative.   All other systems reviewed and are negative.   Blood pressure 125/76, pulse 84, temperature 97.6 F (36.4 C), temperature source Oral, resp. rate 18, height 5' 9"  (1.753 m), weight 56.7 kg (125 lb), SpO2 96 %.Body mass index is 18.46 kg/m.  General Appearance: Casual  Eye Contact:  Good  Speech:  Clear and Coherent  Volume:  Normal  Mood:  Euthymic  Affect:  Appropriate  Thought Process:  Goal Directed and Descriptions of Associations: Intact  Orientation:  Full (Time, Place, and Person)  Thought Content:  WDL  Suicidal Thoughts:  No  Homicidal Thoughts:  No  Memory:  Immediate;   Fair Recent;   Fair Remote;   Fair  Judgement:  Impaired  Insight:  Lacking  Psychomotor Activity:  Normal  Concentration:  Concentration: Fair and Attention Span: Fair  Recall:  [employer] of Knowledge:  Fair  Language:  Fair  Akathisia:  No  Handed:  Right  AIMS (if indicated):     Assets:  Communication Skills Desire for Improvement Financial Resources/Insurance Housing Physical Health Resilience Social Support  ADL's:  Intact  Cognition:  WNL  Sleep:        Treatment Plan Summary: Medication management   PLAN:  1. The patient no longer meets criteria for IVC. I will terminate proceedings. Please discharge as appropriate.  2. No medication adjustments are offered. The patient needs no Rx.  3. Group home should pick him up.   Disposition: No evidence of imminent risk to self or others at present.   Patient does not meet criteria for psychiatric inpatient admission. Discussed crisis plan, support from social network, calling 911, coming to the Emergency Department, and calling Suicide Hotline.  Orson Slick, MD 08/22/2017 1:05 PM

## 2017-08-22 NOTE — ED Notes (Signed)
Patient states that he feels anxious , nurse noted that he is pacing, nurse will administer prn medication for anxiety.

## 2017-08-22 NOTE — ED Notes (Signed)
Patient alert and oriented, warm and dry, in no acute distress. Patient denies SI, HI, AVH and pain. Patient made aware of Q15 minute rounds and security cameras for their safety. Patient instructed to come to me with needs or concerns. 

## 2017-08-22 NOTE — ED Notes (Signed)
Patient is sleeping, no signs of distress, still awaiting his ride to go back to group home.

## 2017-08-22 NOTE — ED Notes (Signed)
Nurse talked with group home provider and Michail Sermon who both are caregivers and Reginald Blair states that they will pick him up after 5 pm, Patient is restless and wanting to leave , keeps coming to door wanting to know when He can leave, Nurse told him to be patient, He is compliant and redirectable.

## 2017-08-22 NOTE — ED Notes (Signed)
Patient talked with Dr. Hinton Dyer, He remained calm.

## 2017-08-22 NOTE — ED Provider Notes (Signed)
-----------------------------------------   7:01 AM on 08/22/2017 -----------------------------------------   Blood pressure 125/76, pulse 84, temperature 97.6 F (36.4 C), temperature source Oral, resp. rate 18, height  (1.753 m), weight 56.7 kg (125 lb), SpO2 96 %.  The patient had no acute events since last update.  Calm and cooperative at this time.  Disposition is pending Psychiatry/Behavioral Medicine team recommendations.     Irean Hong, MD 08/22/17 [phone]

## 2017-08-22 NOTE — ED Notes (Signed)
Nurse talked with patient and He states that he thinks if his medicine is right that He will be ok, Patient denies Si/hi or avh, patient wants to be able to go back to group home , and he is calm , cooperative, no behavioral issues, He hopes to talk to Doctor and be able to go back home, He states that he deal with anxiety often, but if He takes medication as He should that He can be ok and function. Patient with q 15 minute checks and camera surveillance in progress for safety.

## 2017-08-26 ENCOUNTER — Emergency Department
Admission: EM | Admit: 2017-08-26 | Discharge: 2017-08-27 | Disposition: A | Discharge status: Home / Self Care | Payer: Medicare Other | Attending: Emergency Medicine | Admitting: Emergency Medicine

## 2017-08-26 ENCOUNTER — Encounter: Payer: Self-pay | Admitting: Emergency Medicine

## 2017-08-26 DIAGNOSIS — I10 Essential (primary) hypertension: Secondary | ICD-10-CM | POA: Insufficient documentation

## 2017-08-26 DIAGNOSIS — Z79899 Other long term (current) drug therapy: Secondary | ICD-10-CM | POA: Diagnosis not present

## 2017-08-26 DIAGNOSIS — X788XXA Intentional self-harm by other sharp object, initial encounter: Secondary | ICD-10-CM | POA: Diagnosis not present

## 2017-08-26 DIAGNOSIS — Z7902 Long term (current) use of antithrombotics/antiplatelets: Secondary | ICD-10-CM | POA: Insufficient documentation

## 2017-08-26 DIAGNOSIS — R45851 Suicidal ideations: Secondary | ICD-10-CM

## 2017-08-26 DIAGNOSIS — F209 Schizophrenia, unspecified: Secondary | ICD-10-CM | POA: Insufficient documentation

## 2017-08-26 DIAGNOSIS — Z8673 Personal history of transient ischemic attack (TIA), and cerebral infarction without residual deficits: Secondary | ICD-10-CM | POA: Insufficient documentation

## 2017-08-26 DIAGNOSIS — Z91040 Latex allergy status: Secondary | ICD-10-CM | POA: Insufficient documentation

## 2017-08-26 DIAGNOSIS — S51812A Laceration without foreign body of left forearm, initial encounter: Secondary | ICD-10-CM | POA: Insufficient documentation

## 2017-08-26 DIAGNOSIS — Y998 Other external cause status: Secondary | ICD-10-CM | POA: Diagnosis not present

## 2017-08-26 DIAGNOSIS — Z87891 Personal history of nicotine dependence: Secondary | ICD-10-CM | POA: Insufficient documentation

## 2017-08-26 DIAGNOSIS — J449 Chronic obstructive pulmonary disease, unspecified: Secondary | ICD-10-CM | POA: Insufficient documentation

## 2017-08-26 DIAGNOSIS — Y92008 Other place in unspecified non-institutional (private) residence as the place of occurrence of the external cause: Secondary | ICD-10-CM | POA: Diagnosis not present

## 2017-08-26 DIAGNOSIS — F32A Depression, unspecified: Secondary | ICD-10-CM

## 2017-08-26 DIAGNOSIS — Y9389 Activity, other specified: Secondary | ICD-10-CM | POA: Insufficient documentation

## 2017-08-26 DIAGNOSIS — F329 Major depressive disorder, single episode, unspecified: Secondary | ICD-10-CM | POA: Diagnosis not present

## 2017-08-26 LAB — COMPREHENSIVE METABOLIC PANEL
ALBUMIN: 4.2 g/dL (ref 3.5–5.0)
ALK PHOS: 57 U/L (ref 38–126)
ALT: 12 U/L — AB (ref 17–63)
AST: 25 U/L (ref 15–41)
Anion gap: 10 (ref 5–15)
BUN: 13 mg/dL (ref 6–20)
CALCIUM: 8.8 mg/dL — AB (ref 8.9–10.3)
CO2: 23 mmol/L (ref 22–32)
CREATININE: 1.06 mg/dL (ref 0.61–1.24)
Chloride: 102 mmol/L (ref 101–111)
GFR calc Af Amer: >60 mL/min (ref >60)
GFR calc non Af Amer: >60 mL/min (ref >60)
GLUCOSE: 77 mg/dL (ref 65–99)
Potassium: 3.9 mmol/L (ref 3.5–5.1)
SODIUM: 135 mmol/L (ref 135–145)
Total Bilirubin: 0.7 mg/dL (ref 0.3–1.2)
Total Protein: 7.9 g/dL (ref 6.5–8.1)

## 2017-08-26 LAB — CBC
HEMATOCRIT: 42.7 % (ref 40.0–52.0)
Hemoglobin: 14.1 g/dL (ref 13.0–18.0)
MCH: 24.6 pg — ABNORMAL LOW (ref 26.0–34.0)
MCHC: 32.9 g/dL (ref 32.0–36.0)
MCV: 74.7 fL — ABNORMAL LOW (ref 80.0–100.0)
Platelets: 112 10*3/uL — ABNORMAL LOW (ref 150–440)
RBC: 5.72 MIL/uL (ref 4.40–5.90)
RDW: 21.3 % — ABNORMAL HIGH (ref 11.5–14.5)
WBC: 4.3 10*3/uL (ref 3.8–10.6)

## 2017-08-26 LAB — URINE DRUG SCREEN, QUALITATIVE (ARMC ONLY)
Amphetamines, Ur Screen: NOT DETECTED
BARBITURATES, UR SCREEN: NOT DETECTED
Benzodiazepine, Ur Scrn: NOT DETECTED
Cannabinoid 50 Ng, Ur ~~LOC~~: NOT DETECTED
Cocaine Metabolite,Ur ~~LOC~~: NOT DETECTED
MDMA (Ecstasy)Ur Screen: NOT DETECTED
METHADONE SCREEN, URINE: NOT DETECTED
Opiate, Ur Screen: NOT DETECTED
Phencyclidine (PCP) Ur S: NOT DETECTED
Tricyclic, Ur Screen: NOT DETECTED

## 2017-08-26 LAB — SALICYLATE LEVEL: Salicylate Lvl: <7 mg/dL (ref 2.8–30.0)

## 2017-08-26 LAB — ETHANOL: Alcohol, Ethyl (B): <10 mg/dL (ref <10)

## 2017-08-26 LAB — ACETAMINOPHEN LEVEL

## 2017-08-26 MED ORDER — ACETAMINOPHEN 325 MG PO TABS
650.0000 mg | ORAL_TABLET | Freq: Once | ORAL | Status: AC
  Administered 2017-08-26: 650 mg via ORAL
  Filled 2017-08-26: qty 2

## 2017-08-26 MED ORDER — OLANZAPINE 20 MG PO TABS: 20.0000 mg | ORAL_TABLET | Freq: Every day | ORAL | 0 refills | Status: AC

## 2017-08-26 MED ORDER — OLANZAPINE 10 MG PO TABS
20.0000 mg | ORAL_TABLET | Freq: Every day | ORAL | Status: DC
  Administered 2017-08-26: 20 mg via ORAL
  Filled 2017-08-26: qty 2

## 2017-08-26 NOTE — ED Notes (Signed)
Report given to SOC 

## 2017-08-26 NOTE — Discharge Instructions (Signed)
You have been seen in the emergency department for a  psychiatric concern. You have been evaluated both medically as well as psychiatrically. Please follow-up with your outpatient resources provided. Return to the emergency department for any worsening symptoms, or any thoughts of hurting yourself or anyone else so that we may attempt to help you.  As we discussed your medications have been adjusted.  Please begin taking Zyprexa 20 mg each day and discontinue use of your Zyprexa 10 mg tablet.

## 2017-08-26 NOTE — ED Notes (Signed)
Pt. Alert and oriented, warm and dry, in no distress. Pt. Denies HI, and VH.Pt states having SI without a plan while here. Patient states hearing voices to kill himself. Patient contracts for safety with this Clinical research associate.  Pt. Encouraged to let nursing staff know of any concerns or needs.

## 2017-08-26 NOTE — ED Triage Notes (Signed)
Pt comes into the ED via [facility] from home c/o suicidal ideation and lacerations present to the inner forearm on the left arm.  All bleeding under control at this time.  Patient states he has been feeling SI for over a week and he doesn't believe his medication is working properly.  Patient has h/o bipolar, depression, and schizophrenia.  aatient in NAD at this time with even and unlabored respirations.

## 2017-08-26 NOTE — ED Notes (Signed)
Pt with 1:1 sitter. Calm and cooperative. Maintained on 15 minute checks and observation by security camera for safety.
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
## 2017-08-26 NOTE — BH Assessment (Signed)
This Clinical research associate called [facility] in [place], [place] [phone]) and did not receive an answer. Unable to leave voicemail. Will try again, pt is appropriate for discharge at this time.

## 2017-08-26 NOTE — ED Notes (Signed)
IVC PAPERS INITIATED BY MD PADUCHOWSKI/RN AMY AND ODS SECURITY MADE AWARE. PT IVC PENDING SOC CONSULT/SOC CALLED. 

## 2017-08-26 NOTE — ED Notes (Signed)
SOC in progress.  

## 2017-08-27 DIAGNOSIS — S51812A Laceration without foreign body of left forearm, initial encounter: Secondary | ICD-10-CM | POA: Diagnosis not present

## 2017-08-27 NOTE — ED Notes (Signed)
ED  Is the patient under IVC or is there intent for IVC: rescinded - pt to diacharge Is the patient medically cleared:  Is there vacancy in the ED BHU:   Is the population mix appropriate for patient:   Is the patient awaiting placement in inpatient or outpatient setting: Yes. -return to his group home Has the patient had a psychiatric consult: Yes.   Survey of unit performed for contraband, proper placement and condition of furniture, tampering with fixtures in bathroom, shower, and each patient room: Yes.  ; Findings:  APPEARANCE/BEHAVIOR Calm and cooperative NEURO ASSESSMENT Orientation: oriented x3  Denies pain Hallucinations: No.None noted (Hallucinations) Speech: Normal Gait: normal RESPIRATORY ASSESSMENT Even  Unlabored respirations  CARDIOVASCULAR ASSESSMENT Pulses equal   regular rate  Skin warm and dry   GASTROINTESTINAL ASSESSMENT no GI complaint EXTREMITIES Full ROM  PLAN OF CARE Provide calm/safe environment. Vital signs assessed twice daily. ED BHU Assessment once each 12-hour shift. Collaborate with TTS daily or as condition indicates. Assure the ED provider has rounded once each shift. Provide and encourage hygiene. Provide redirection as needed. Assess for escalating behavior; address immediately and inform ED provider.  Assess family dynamic and appropriateness for visitation as needed: Yes.  ; If necessary, describe findings:  Educate the patient/family about BHU procedures/visitation: Yes.  ; If necessary, describe findings:

## 2017-08-27 NOTE — ED Notes (Signed)
Received a call from someone inquiring about him and that she had missed a call from [place]  - I informed her that this pt has been cleared for discharge back to his group home  She reports that she will arrange transportation  - would not give eta  Provided a phone number  [phone] if someone does not arrive shortly    Group home is in [facility] [place]  >1 hour at least before transportation arrives

## 2017-08-27 NOTE — ED Notes (Signed)
This Clinical research associate called [facility] at [phone]. Left HIPPA compliant message for return call.  Called Group home at [phone] notified group home staff that patient is up for discharge and needs to be picked up. Staff states need to call supervisor. This Clinical research associate called number that was given [phone]  Someone answered and then hung up, this writer called again, call went to voicemail but was unable to leave message.
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
## 2017-08-27 NOTE — ED Provider Notes (Addendum)
-----------------------------------------   6:51 AM on 08/27/2017 -----------------------------------------   Blood pressure 104/64, pulse 67, resp. rate 14, height 1.753 m ( ), weight 56.7 kg (125 lb), SpO2 96 %.  The patient had no acute events since last update.  Calm and cooperative at this time.  The patient has been cleared by psychiatry and was supposed to be discharged back to his group home, but reportedly the group home is refusing to pick him up or even answer the phone.  TTS and nursing staff continue to try and make arrangements with the group home.  He is not under involuntary commitment at this time.   Loleta Rose, MD 08/27/17 1610    Loleta Rose, MD 08/27/17 [id number]

## 2017-08-27 NOTE — ED Notes (Signed)
Patient observed lying in bed with eyes closed  Even, unlabored respirations observed   NAD pt appears to be sleeping  I will continue to monitor along with every 15 minute visual observations and ongoing security monitoring    

## 2017-08-27 NOTE — ED Notes (Signed)

## 2017-08-27 NOTE — ED Notes (Signed)
BEHAVIORAL HEALTH ROUNDING Patient sleeping: No. Patient alert and oriented: yes Behavior appropriate: Yes.  ; If no, describe:  Nutrition and fluids offered: yes Toileting and hygiene offered: Yes  Sitter present: q15 minute observations and security monitoring Law enforcement present: Yes    
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
